# Patient Record
Sex: Female | Born: 1960 | Race: White | Hispanic: No | Marital: Married | Smoking: Never smoker
Health system: Southern US, Community
[De-identification: ages and names within clinical notes are randomized; demographics above are authoritative.]

## PROBLEM LIST (undated history)

## (undated) ENCOUNTER — Emergency Department (HOSPITAL_BASED_OUTPATIENT_CLINIC_OR_DEPARTMENT_OTHER): Disposition: A | Discharge status: Home / Self Care | Payer: Self-pay

## (undated) DIAGNOSIS — T8859XA Other complications of anesthesia, initial encounter: Secondary | ICD-10-CM

## (undated) DIAGNOSIS — B019 Varicella without complication: Secondary | ICD-10-CM

## (undated) DIAGNOSIS — I341 Nonrheumatic mitral (valve) prolapse: Secondary | ICD-10-CM

## (undated) DIAGNOSIS — T7840XA Allergy, unspecified, initial encounter: Secondary | ICD-10-CM

## (undated) DIAGNOSIS — T4145XA Adverse effect of unspecified anesthetic, initial encounter: Secondary | ICD-10-CM

## (undated) HISTORY — DX: Nonrheumatic mitral (valve) prolapse: I34.1

## (undated) HISTORY — DX: Allergy, unspecified, initial encounter: T78.40XA

## (undated) HISTORY — DX: Other complications of anesthesia, initial encounter: T88.59XA

## (undated) HISTORY — PX: DILATION AND CURETTAGE OF UTERUS: SHX78

## (undated) HISTORY — DX: Varicella without complication: B01.9

---
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
## 1982-10-09 HISTORY — PX: BREAST BIOPSY: SHX20

## 2001-10-09 HISTORY — PX: APPENDECTOMY: SHX54

## 2013-06-18 ENCOUNTER — Ambulatory Visit: Payer: Self-pay | Admitting: Family Medicine

## 2013-07-01 ENCOUNTER — Ambulatory Visit: Payer: Self-pay | Admitting: Family Medicine

## 2013-07-04 ENCOUNTER — Encounter: Payer: Self-pay | Admitting: Family Medicine

## 2013-07-04 ENCOUNTER — Ambulatory Visit (INDEPENDENT_AMBULATORY_CARE_PROVIDER_SITE_OTHER): Payer: BC Managed Care – PPO | Admitting: Family Medicine

## 2013-07-04 VITALS — BP 108/68 | HR 75 | Temp 98.1°F | Ht 62.0 in | Wt 129.0 lb

## 2013-07-04 DIAGNOSIS — M722 Plantar fascial fibromatosis: Secondary | ICD-10-CM

## 2013-07-04 DIAGNOSIS — D179 Benign lipomatous neoplasm, unspecified: Secondary | ICD-10-CM

## 2013-07-04 DIAGNOSIS — Z1211 Encounter for screening for malignant neoplasm of colon: Secondary | ICD-10-CM

## 2013-07-04 NOTE — Progress Notes (Signed)
  Subjective:    Patient ID: Rebekah Pratt, female    DOB: 02/24/1961, 52 y.o.   MRN: [id number]  HPI Patient is seen to establish care Generally very healthy. She's had some occasional seasonal allergies. She takes no regular medications. She had ruptured appendix back in 2003. She's had previous tonsillectomy. 1980- breast biopsy which was benign.  Patient has family history of CAD. Father had bypass at age 49 and also history of hypertension. Family history otherwise unrevealing. Patient is married. Nonsmoker. Only social alcohol use.  Left heel pain intermittently. Worse first thing in the morning.  Location is plantar fascia. 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 Not tender.  Patient never had screening colonoscopy. She gets regular GYN followup. She refuses flu vaccine  Past Medical History  Diagnosis Date  . Chicken pox   . Allergy    Past Surgical History  Procedure Laterality Date  . Breast biopsy Bilateral 1984  . Appendectomy    . Tonsillectomy and adenoidectomy  1970    reports that she has never smoked. She does not have any smokeless tobacco history on file. She reports that  drinks alcohol. She reports that she does not use illicit drugs. family history includes Arthritis in her father; Heart disease (age of onset: 61) in her father; Hypertension in her father. Allergies  Allergen Reactions  . Penicillins       Review of Systems  Constitutional: Negative for fever, chills and unexpected weight change.  Respiratory: Negative for cough and shortness of breath.   Gastrointestinal: Negative for nausea and vomiting.  Endocrine: Negative for polydipsia and polyuria.  Genitourinary: Negative for dysuria.  Neurological: Negative for dizziness.  Hematological: Negative for adenopathy.       Objective:   Physical Exam  Constitutional: She  appears well-developed and well-nourished.  Neck: Neck supple. No thyromegaly present.  Cardiovascular: Normal rate and regular rhythm.   Pulmonary/Chest: Effort normal and breath sounds normal. No respiratory distress. She has no wheezes. She has no rales.  Lymphadenopathy:    She has no cervical adenopathy.  Skin:  Left upper posterior thigh reveals approximately 1 cm fatty type mass which is minimally mobile and nontender          Assessment & Plan:  #1 plantar fasciitis. We discussed various treatments. Minimally symptomatic at this time. 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 This has been present for 20 years and unchanged. Reassurance #3 health maintenance. Needs screening colonoscopy. We'll set up

## 2013-07-04 NOTE — Patient Instructions (Signed)

## 2013-08-18 ENCOUNTER — Ambulatory Visit (AMBULATORY_SURGERY_CENTER): Payer: Self-pay

## 2013-08-18 DIAGNOSIS — Z1211 Encounter for screening for malignant neoplasm of colon: Secondary | ICD-10-CM

## 2013-08-18 MED ORDER — MOVIPREP 100 G PO SOLR: 1.0000 | Freq: Once | ORAL | Status: DC

## 2013-08-21 ENCOUNTER — Encounter: Payer: Self-pay | Admitting: Internal Medicine

## 2013-08-22 ENCOUNTER — Ambulatory Visit (INDEPENDENT_AMBULATORY_CARE_PROVIDER_SITE_OTHER): Payer: BC Managed Care – PPO | Admitting: Family Medicine

## 2013-08-22 ENCOUNTER — Encounter: Payer: Self-pay | Admitting: Family Medicine

## 2013-08-22 VITALS — BP 118/70 | HR 65 | Temp 97.6°F | Wt 135.0 lb

## 2013-08-22 DIAGNOSIS — R300 Dysuria: Secondary | ICD-10-CM

## 2013-08-22 DIAGNOSIS — N390 Urinary tract infection, site not specified: Secondary | ICD-10-CM

## 2013-08-22 LAB — POCT URINALYSIS DIPSTICK
Bilirubin, UA: NEGATIVE
Glucose, UA: NEGATIVE
Ketones, UA: NEGATIVE
Nitrite, UA: NEGATIVE
Protein, UA: NEGATIVE
Spec Grav, UA: <=1.005
pH, UA: 6

## 2013-08-22 MED ORDER — NITROFURANTOIN MONOHYD MACRO 100 MG PO CAPS: 100.0000 mg | ORAL_CAPSULE | Freq: Two times a day (BID) | ORAL | Status: DC

## 2013-08-22 NOTE — Patient Instructions (Signed)
Urinary Tract Infection  Urinary tract infections (UTIs) can develop anywhere along your urinary tract. Your urinary tract is your body's drainage system for removing wastes and extra water. Your urinary tract includes two kidneys, two ureters, a bladder, and a urethra. Your kidneys are a pair of bean-shaped organs. Each kidney is about the size of your fist. They are located below your ribs, one on each side of your spine.  CAUSES  Infections are caused by microbes, which are microscopic organisms, including fungi, viruses, and bacteria. These organisms are so small that they can only be seen through a microscope. Bacteria are the microbes that most commonly cause UTIs.  SYMPTOMS   Symptoms of UTIs may vary by age and gender of the patient and by the location of the infection. Symptoms in young women typically include a frequent and intense urge to urinate and a painful, burning feeling in the bladder or urethra during urination. Older women and men are more likely to be tired, shaky, and weak and have muscle aches and abdominal pain. A fever may mean the infection is in your kidneys. Other symptoms of a kidney infection include pain in your back or sides below the ribs, nausea, and vomiting.  DIAGNOSIS  To diagnose a UTI, your caregiver will ask you about your symptoms. Your caregiver also will ask to provide a urine sample. The urine sample will be tested for bacteria and white blood cells. White blood cells are made by your body to help fight infection.  TREATMENT   Typically, UTIs can be treated with medication. Because most UTIs are caused by a bacterial infection, they usually can be treated with the use of antibiotics. The choice of antibiotic and length of treatment depend on your symptoms and the type of bacteria causing your infection.  HOME CARE INSTRUCTIONS   If you were prescribed antibiotics, take them exactly as your caregiver instructs you. Finish the medication even if you feel better after you  have only taken some of the medication.   Drink enough water and fluids to keep your urine clear or pale yellow.   Avoid caffeine, tea, and carbonated beverages. They tend to irritate your bladder.   Empty your bladder often. Avoid holding urine for long periods of time.   Empty your bladder before and after sexual intercourse.   After a bowel movement, women should cleanse from front to back. Use each tissue only once.  SEEK MEDICAL CARE IF:    You have back pain.   You develop a fever.   Your symptoms do not begin to resolve within 3 days.  SEEK IMMEDIATE MEDICAL CARE IF:    You have severe back pain or lower abdominal pain.   You develop chills.   You have nausea or vomiting.   You have continued burning or discomfort with urination.  MAKE SURE YOU:    Understand these instructions.   Will watch your condition.   Will get help right away if you are not doing well or get worse.  Document Released: 07/05/2005 Document Revised: 03/26/2012 Document Reviewed: 11/03/2011  [employer] Patient Information 2014 [employer].

## 2013-08-22 NOTE — Progress Notes (Signed)
Pre visit review using our clinic review tool, if applicable. No additional management support is needed unless otherwise documented below in the visit note. 

## 2013-08-22 NOTE — Progress Notes (Signed)
  Subjective:    Patient ID: Rebekah Pratt, female    DOB: [birth year], 52 y.o.   MRN: [id number]  HPI Patient seen for acute visit Onset a few days ago of some burning with urination and actually had a little bit of mild hematuria earlier today. No nausea or vomiting. No flank pain. No fevers or chills. Drinking fluids well. She's had previous history of UTI but not for least 3 years. No recent antibiotic use. No recent procedures or instrumentation.  Past Medical History  Diagnosis Date  . Chicken pox   . Allergy    Past Surgical History  Procedure Laterality Date  . Breast biopsy Bilateral 1984  . Appendectomy    . Tonsillectomy and adenoidectomy  1970    reports that she has never smoked. She does not have any smokeless tobacco history on file. She reports that she drinks alcohol. She reports that she does not use illicit drugs. family history includes Arthritis in her father; Heart disease (age of onset: 56) in her father; Hypertension in her father. There is no history of Colon cancer, Pancreatic cancer, or Stomach cancer. Allergies  Allergen Reactions  . Penicillins       Review of Systems  Constitutional: Negative for fever, chills and appetite change.  Gastrointestinal: Negative for nausea, vomiting, abdominal pain, diarrhea and constipation.  Genitourinary: Positive for dysuria and frequency.  Musculoskeletal: Negative for back pain.  Neurological: Negative for dizziness.       Objective:   Physical Exam  Constitutional: She appears well-developed and well-nourished.  HENT:  Head: Normocephalic and atraumatic.  Neck: Neck supple. No thyromegaly present.  Cardiovascular: Normal rate, regular rhythm and normal heart sounds.   Pulmonary/Chest: Breath sounds normal.  Abdominal: Soft. Bowel sounds are normal. There is no tenderness.          Assessment & Plan:  Probable uncomplicated cystitis. Start Macrobid 1 twice a day for 5 days. Increase fluid intake.  Followup if symptoms persist or worsen

## 2013-08-26 ENCOUNTER — Encounter: Payer: BC Managed Care – PPO | Admitting: Internal Medicine

## 2013-10-13 ENCOUNTER — Encounter: Payer: BC Managed Care – PPO | Admitting: Internal Medicine

## 2013-10-31 ENCOUNTER — Encounter: Payer: Self-pay | Admitting: Family Medicine

## 2013-10-31 ENCOUNTER — Ambulatory Visit (INDEPENDENT_AMBULATORY_CARE_PROVIDER_SITE_OTHER): Payer: BC Managed Care – PPO | Admitting: Family Medicine

## 2013-10-31 VITALS — BP 110/70 | HR 60 | Temp 98.0°F | Wt 133.0 lb

## 2013-10-31 DIAGNOSIS — M722 Plantar fascial fibromatosis: Secondary | ICD-10-CM

## 2013-10-31 NOTE — Progress Notes (Signed)
Pre visit review using our clinic review tool, if applicable. No additional management support is needed unless otherwise documented below in the visit note. 

## 2013-10-31 NOTE — Patient Instructions (Signed)
Plantar Fasciitis (Heel Spur Syndrome)  with Rehab  The plantar fascia is a fibrous, ligament-like, soft-tissue structure that spans the bottom of the foot. Plantar fasciitis is a condition that causes pain in the foot due to inflammation of the tissue.  SYMPTOMS   · Pain and tenderness on the underneath side of the foot.  · Pain that worsens with standing or walking.  CAUSES   Plantar fasciitis is caused by irritation and injury to the plantar fascia on the underneath side of the foot. Common mechanisms of injury include:  · Direct trauma to bottom of the foot.  · Damage to a small nerve that runs under the foot where the main fascia attaches to the heel bone.  · Stress placed on the plantar fascia due to bone spurs.  RISK INCREASES WITH:   · Activities that place stress on the plantar fascia (running, jumping, pivoting, or cutting).  · Poor strength and flexibility.  · Improperly fitted shoes.  · Tight calf muscles.  · Flat feet.  · Failure to warm-up properly before activity.  · Obesity.  PREVENTION  · Warm up and stretch properly before activity.  · Allow for adequate recovery between workouts.  · Maintain physical fitness:  · Strength, flexibility, and endurance.  · Cardiovascular fitness.  · Maintain a health body weight.  · Avoid stress on the plantar fascia.  · Wear properly fitted shoes, including arch supports for individuals who have flat feet.  PROGNOSIS   If treated properly, then the symptoms of plantar fasciitis usually resolve without surgery. However, occasionally surgery is necessary.  RELATED COMPLICATIONS   · Recurrent symptoms that may result in a chronic condition.  · Problems of the lower back that are caused by compensating for the injury, such as limping.  · Pain or weakness of the foot during push-off following surgery.  · Chronic inflammation, scarring, and partial or complete fascia tear, occurring more often from repeated injections.  TREATMENT   Treatment initially involves the use of  ice and medication to help reduce pain and inflammation. The use of strengthening and stretching exercises may help reduce pain with activity, especially stretches of the Achilles tendon. These exercises may be performed at home or with a therapist. Your caregiver may recommend that you use heel cups of arch supports to help reduce stress on the plantar fascia. Occasionally, corticosteroid injections are given to reduce inflammation. If symptoms persist for greater than 6 months despite non-surgical (conservative), then surgery may be recommended.   MEDICATION   · If pain medication is necessary, then nonsteroidal anti-inflammatory medications, such as aspirin and ibuprofen, or other minor pain relievers, such as acetaminophen, are often recommended.  · Do not take pain medication within 7 days before surgery.  · Prescription pain relievers may be given if deemed necessary by your caregiver. Use only as directed and only as much as you need.  · Corticosteroid injections may be given by your caregiver. These injections should be reserved for the most serious cases, because they may only be given a certain number of times.  HEAT AND COLD  · Cold treatment (icing) relieves pain and reduces inflammation. Cold treatment should be applied for 10 to 15 minutes every 2 to 3 hours for inflammation and pain and immediately after any activity that aggravates your symptoms. Use ice packs or massage the area with a piece of ice (ice massage).  · Heat treatment may be used prior to performing the stretching and strengthening activities prescribed   by your caregiver, physical therapist, or athletic trainer. Use a heat pack or soak the injury in warm water.  SEEK IMMEDIATE MEDICAL CARE IF:  · Treatment seems to offer no benefit, or the condition worsens.  · Any medications produce adverse side effects.  EXERCISES  RANGE OF MOTION (ROM) AND STRETCHING EXERCISES - Plantar Fasciitis (Heel Spur Syndrome)  These exercises may help you  when beginning to rehabilitate your injury. Your symptoms may resolve with or without further involvement from your physician, physical therapist or athletic trainer. While completing these exercises, remember:   · Restoring tissue flexibility helps normal motion to return to the joints. This allows healthier, less painful movement and activity.  · An effective stretch should be held for at least 30 seconds.  · A stretch should never be painful. You should only feel a gentle lengthening or release in the stretched tissue.  RANGE OF MOTION - Toe Extension, Flexion  · Sit with your right / left leg crossed over your opposite knee.  · Grasp your toes and gently pull them back toward the top of your foot. You should feel a stretch on the bottom of your toes and/or foot.  · Hold this stretch for __________ seconds.  · Now, gently pull your toes toward the bottom of your foot. You should feel a stretch on the top of your toes and or foot.  · Hold this stretch for __________ seconds.  Repeat __________ times. Complete this stretch __________ times per day.   RANGE OF MOTION - Ankle Dorsiflexion, Active Assisted  · Remove shoes and sit on a chair that is preferably not on a carpeted surface.  · Place right / left foot under knee. Extend your opposite leg for support.  · Keeping your heel down, slide your right / left foot back toward the chair until you feel a stretch at your ankle or calf. If you do not feel a stretch, slide your bottom forward to the edge of the chair, while still keeping your heel down.  · Hold this stretch for __________ seconds.  Repeat __________ times. Complete this stretch __________ times per day.   STRETCH  Gastroc, Standing  · Place hands on wall.  · Extend right / left leg, keeping the front knee somewhat bent.  · Slightly point your toes inward on your back foot.  · Keeping your right / left heel on the floor and your knee straight, shift your weight toward the wall, not allowing your back to  arch.  · You should feel a gentle stretch in the right / left calf. Hold this position for __________ seconds.  Repeat __________ times. Complete this stretch __________ times per day.  STRETCH  Soleus, Standing  · Place hands on wall.  · Extend right / left leg, keeping the other knee somewhat bent.  · Slightly point your toes inward on your back foot.  · Keep your right / left heel on the floor, bend your back knee, and slightly shift your weight over the back leg so that you feel a gentle stretch deep in your back calf.  · Hold this position for __________ seconds.  Repeat __________ times. Complete this stretch __________ times per day.  STRETCH  Gastrocsoleus, Standing   Note: This exercise can place a lot of stress on your foot and ankle. Please complete this exercise only if specifically instructed by your caregiver.   · Place the ball of your right / left foot on a step, keeping   your other foot firmly on the same step.  · Hold on to the wall or a rail for balance.  · Slowly lift your other foot, allowing your body weight to press your heel down over the edge of the step.  · You should feel a stretch in your right / left calf.  · Hold this position for __________ seconds.  · Repeat this exercise with a slight bend in your right / left knee.  Repeat __________ times. Complete this stretch __________ times per day.   STRENGTHENING EXERCISES - Plantar Fasciitis (Heel Spur Syndrome)   These exercises may help you when beginning to rehabilitate your injury. They may resolve your symptoms with or without further involvement from your physician, physical therapist or athletic trainer. While completing these exercises, remember:   · Muscles can gain both the endurance and the strength needed for everyday activities through controlled exercises.  · Complete these exercises as instructed by your physician, physical therapist or athletic trainer. Progress the resistance and repetitions only as guided.  STRENGTH - Towel  Curls  · Sit in a chair positioned on a non-carpeted surface.  · Place your foot on a towel, keeping your heel on the floor.  · Pull the towel toward your heel by only curling your toes. Keep your heel on the floor.  · If instructed by your physician, physical therapist or athletic trainer, add ____________________ at the end of the towel.  Repeat __________ times. Complete this exercise __________ times per day.  STRENGTH - Ankle Inversion  · Secure one end of a rubber exercise band/tubing to a fixed object (table, pole). Loop the other end around your foot just before your toes.  · Place your fists between your knees. This will focus your strengthening at your ankle.  · Slowly, pull your big toe up and in, making sure the band/tubing is positioned to resist the entire motion.  · Hold this position for __________ seconds.  · Have your muscles resist the band/tubing as it slowly pulls your foot back to the starting position.  Repeat __________ times. Complete this exercises __________ times per day.   Document Released: 09/25/2005 Document Revised: 12/18/2011 Document Reviewed: 01/07/2009  [employer]® Patient Information ©2014 [employer].

## 2013-10-31 NOTE — Progress Notes (Signed)
   Subjective:    Patient ID: Rebekah Pratt, female    DOB: [birth year], 53 y.o.   MRN: [id number]  HPI Patient seen with left heel pain. She has history of plantar fasciitis. She's had several months now probably 6 months of pain which is worse first thing in the morning and after prolonged periods of sitting. Her pain actually improves after she walks for a ways. She's not had any Achilles pain this is on the plantar aspect of the foot. She's not seen bruising or swelling. No injury. She tried some icing briefly without much improvement. She's done some stretching. She has fairly high arches and does not use any arch support. Currently not doing any running.  Past Medical History  Diagnosis Date  . Chicken pox   . Allergy    Past Surgical History  Procedure Laterality Date  . Breast biopsy Bilateral 1984  . Appendectomy    . Tonsillectomy and adenoidectomy  1970    reports that she has never smoked. She does not have any smokeless tobacco history on file. She reports that she drinks alcohol. She reports that she does not use illicit drugs. family history includes Arthritis in her father; Heart disease (age of onset: 54) in her father; Hypertension in her father. There is no history of Colon cancer, Pancreatic cancer, or Stomach cancer. Allergies  Allergen Reactions  . Penicillins       Review of Systems  Neurological: Negative for weakness and numbness.       Objective:   Physical Exam  Constitutional: She appears well-developed and well-nourished.  Cardiovascular: Normal rate and regular rhythm.   Pulmonary/Chest: Effort normal and breath sounds normal. No respiratory distress. She has no wheezes. She has no rales.  Musculoskeletal:  Left foot reveals no visible swelling. Good distal foot pulses. She has some tenderness of the heel near attachment plantar fascia to the calcaneus. No Achilles tenderness. Otherwise no foot tenderness.          Assessment & Plan:  Left  plantar fasciitis. We reviewed treatments including stretching, icing, anti-inflammatories, heel cups. She does not wish to pursue injection therapy at this time. We discussed arch supports. Consider referral to sports medicine for possible orthotics if not improving.

## 2014-01-16 LAB — HM MAMMOGRAPHY: HM MAMMO: NEGATIVE

## 2014-03-30 ENCOUNTER — Encounter: Payer: Self-pay | Admitting: Gastroenterology

## 2014-04-13 ENCOUNTER — Ambulatory Visit (INDEPENDENT_AMBULATORY_CARE_PROVIDER_SITE_OTHER): Payer: BC Managed Care – PPO | Admitting: Family Medicine

## 2014-04-13 DIAGNOSIS — Z111 Encounter for screening for respiratory tuberculosis: Secondary | ICD-10-CM

## 2014-04-13 DIAGNOSIS — Z0289 Encounter for other administrative examinations: Secondary | ICD-10-CM

## 2014-04-13 MED ORDER — FLUTICASONE PROPIONATE 50 MCG/ACT NA SUSP: 2.0000 | NASAL | Status: DC | PRN

## 2014-04-13 NOTE — Progress Notes (Signed)
   Subjective:    Patient ID: Rebekah Pratt, female    DOB: 1961/06/06, 53 y.o.   MRN: [id number]  HPI Patient here for form completion. She plans to work in [employer].  She has no major chronic problems. No history of tuberculosis or other communicable diseases. She thinks she had tetanus in 2010. Measles mumps rubella given previously. Denies any heart or lung problems. No major orthopedic concerns other than some intermittent neck pains. She has seen specialist regarding that.    Past Medical History  Diagnosis Date  . Chicken pox   . Allergy    Past Surgical History  Procedure Laterality Date  . Breast biopsy Bilateral 1984  . Appendectomy    . Tonsillectomy and adenoidectomy  1970    reports that she has never smoked. She does not have any smokeless tobacco history on file. She reports that she drinks alcohol. She reports that she does not use illicit drugs. family history includes Arthritis in her father; Heart disease (age of onset: 35) in her father; Hypertension in her father. There is no history of Colon cancer, Pancreatic cancer, or Stomach cancer. Allergies  Allergen Reactions  . Penicillins       Review of Systems  Constitutional: Negative for fatigue.  Eyes: Negative for visual disturbance.  Respiratory: Negative for cough, chest tightness, shortness of breath and wheezing.   Cardiovascular: Negative for chest pain, palpitations and leg swelling.  Neurological: Negative for dizziness, seizures, syncope, weakness, light-headedness and headaches.       Objective:   Physical Exam  Constitutional: She appears well-developed and well-nourished.  Neck: Neck supple. No thyromegaly present.  Cardiovascular: Normal rate and regular rhythm.   No murmur heard. Pulmonary/Chest: Effort normal and breath sounds normal. No respiratory distress. She has no wheezes. She has no rales.  Musculoskeletal: She exhibits no edema.  Lymphadenopathy:    She has no  cervical adenopathy.          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 No specific risk factors for tuberculosis-or other communicable disease. PPD placed. Return in 48-72 hours to have this read.

## 2014-04-13 NOTE — Patient Instructions (Signed)
Return Wednesday for PPD to be read

## 2014-04-13 NOTE — Progress Notes (Signed)
Pre visit review using our clinic review tool, if applicable. No additional management support is needed unless otherwise documented below in the visit note. 

## 2014-04-15 LAB — TB SKIN TEST
INDURATION: 0 mm
TB Skin Test: NEGATIVE

## 2014-04-17 ENCOUNTER — Ambulatory Visit (AMBULATORY_SURGERY_CENTER): Payer: Self-pay | Admitting: *Deleted

## 2014-04-17 VITALS — Ht 62.0 in | Wt 130.4 lb

## 2014-04-17 DIAGNOSIS — Z1211 Encounter for screening for malignant neoplasm of colon: Secondary | ICD-10-CM

## 2014-04-17 NOTE — Progress Notes (Signed)
No allergies to eggs or soy. No problems with anesthesia.  Pt given Emmi instructions for colonoscopy  No oxygen use  No diet drug use  

## 2014-04-27 ENCOUNTER — Encounter: Payer: Self-pay | Admitting: Gastroenterology

## 2014-05-08 ENCOUNTER — Encounter: Payer: Self-pay | Admitting: Gastroenterology

## 2014-05-08 ENCOUNTER — Ambulatory Visit (AMBULATORY_SURGERY_CENTER): Payer: BC Managed Care – PPO | Admitting: Gastroenterology

## 2014-05-08 VITALS — BP 109/48 | HR 54 | Temp 97.5°F | Resp 40 | Ht 62.0 in | Wt 130.0 lb

## 2014-05-08 DIAGNOSIS — D126 Benign neoplasm of colon, unspecified: Secondary | ICD-10-CM

## 2014-05-08 DIAGNOSIS — Z1211 Encounter for screening for malignant neoplasm of colon: Secondary | ICD-10-CM

## 2014-05-08 MED ORDER — SODIUM CHLORIDE 0.9 % IV SOLN: 500.0000 mL | INTRAVENOUS | Status: DC

## 2014-05-08 NOTE — Progress Notes (Signed)
Called to room to assist during endoscopic procedure.  Patient ID and intended procedure confirmed with present staff. Received instructions for my participation in the procedure from the performing physician.  

## 2014-05-08 NOTE — Patient Instructions (Signed)
YOU HAD AN ENDOSCOPIC PROCEDURE TODAY AT THE [facility]: Refer to the procedure report that was given to you for any specific questions about what was found during the examination.  If the procedure report does not answer your questions, please call your gastroenterologist to clarify.  If you requested that your care partner not be given the details of your procedure findings, then the procedure report has been included in a sealed envelope for you to review at your convenience later.  YOU SHOULD EXPECT: Some feelings of bloating in the abdomen. Passage of more gas than usual.  Walking can help get rid of the air that was put into your GI tract during the procedure and reduce the bloating. If you had a lower endoscopy (such as a colonoscopy or flexible sigmoidoscopy) you may notice spotting of blood in your stool or on the toilet paper. If you underwent a bowel prep for your procedure, then you may not have a normal bowel movement for a few days.  DIET: Your first meal following the procedure should be a light meal and then it is ok to progress to your normal diet.  A half-sandwich or bowl of soup is an example of a good first meal.  Heavy or fried foods are harder to digest and may make you feel nauseous or bloated.  Likewise meals heavy in dairy and vegetables can cause extra gas to form and this can also increase the bloating.  Drink plenty of fluids but you should avoid alcoholic beverages for 24 hours.  ACTIVITY: Your care partner should take you home directly after the procedure.  You should plan to take it easy, moving slowly for the rest of the day.  You can resume normal activity the day after the procedure however you should NOT DRIVE or use heavy machinery for 24 hours (because of the sedation medicines used during the test).    SYMPTOMS TO REPORT IMMEDIATELY: A gastroenterologist can be reached at any hour.  During normal business hours, 8:30 AM to 5:00 PM Monday through Friday,  call [phone].  After hours and on weekends, please call the GI answering service at [phone] who will take a message and have the physician on call contact you.   Following lower endoscopy (colonoscopy or flexible sigmoidoscopy):  Excessive amounts of blood in the stool  Significant tenderness or worsening of abdominal pains  Swelling of the abdomen that is new, acute  Fever of 100F or higher  FOLLOW UP: If any biopsies were taken you will be contacted by phone or by letter within the next 1-3 weeks.  Call your gastroenterologist if you have not heard about the biopsies in 3 weeks.  Our staff will call the home number listed on your records the next business day following your procedure to check on you and address any questions or concerns that you may have at that time regarding the information given to you following your procedure. This is a courtesy call and so if there is no answer at the home number and we have not heard from you through the emergency physician on call, we will assume that you have returned to your regular daily activities without incident.  SIGNATURES/CONFIDENTIALITY: You and/or your care partner have signed paperwork which will be entered into your electronic medical record.  These signatures attest to the fact that that the information above on your After Visit Summary has been reviewed and is understood.  Full responsibility of the confidentiality of this   discharge information lies with you and/or your care-partner.  Recommendations Await pathology results. Next colonoscopy in 5 years if polyp adenomatous; otherwise 10 years.

## 2014-05-08 NOTE — Op Note (Signed)
[place]  [address]. [place], [id number]   COLONOSCOPY PROCEDURE REPORT  PATIENT: Rebekah Pratt, Rebekah Pratt  MR#: [id number] BIRTHDATE: Jun 23, 1961 , 56  yrs. old GENDER: Female ENDOSCOPIST: Ladene Artist, MD, [facility] REFERRED VQ:[id number] Elease Hashimoto, M.D. PROCEDURE DATE:  05/08/2014 PROCEDURE:   Colonoscopy with snare polypectomy First Screening Colonoscopy - Avg.  risk and is 50 yrs.  old or older Yes.  Prior Negative Screening - Now for repeat screening. N/A  History of Adenoma - Now for follow-up colonoscopy & has been > or = to 3 yrs.  N/A  Polyps Removed Today? Yes. ASA CLASS:   Class I INDICATIONS:average risk screening. MEDICATIONS: MAC sedation, administered by CRNA and propofol (Diprivan) 250mg  IV DESCRIPTION OF PROCEDURE:   After the risks benefits and alternatives of the procedure were thoroughly explained, informed consent was obtained.  A digital rectal exam revealed no abnormalities of the rectum.   The LB [id number]  endoscope was introduced through the anus and advanced to the cecum, which was identified by both the appendix and ileocecal valve. No adverse events experienced.   The quality of the prep was excellent, using MoviPrep  The instrument was then slowly withdrawn as the colon was fully examined.  COLON FINDINGS: A sessile polyp measuring 6 mm in size was found at the ileocecal valve.  A polypectomy was performed with a cold snare.  The resection was complete and the polyp tissue was completely retrieved.   The colon was otherwise normal.  There was no diverticulosis, inflammation, polyps or cancers unless previously stated.  Retroflexed views revealed no abnormalities. The time to cecum=4 minutes 21 seconds.  Withdrawal time=8 minutes 16 seconds.  The scope was withdrawn and the procedure completed. COMPLICATIONS: There were no complications.  ENDOSCOPIC IMPRESSION: 1.   Sessile polyp measuring 6 mm at the ileocecal valve; polypectomy  performed with a cold snare 2.   The colon was otherwise normal  RECOMMENDATIONS: 1.  Await pathology results 2.  Repeat colonoscopy in 5 years if polyp adenomatous; otherwise 10 years  eSigned:  Ladene Artist, MD, [facility] 05/08/2014 1:47 PM

## 2014-05-08 NOTE — Progress Notes (Signed)
Report to PACU, RN, vss, BBS= Clear.  

## 2014-05-11 ENCOUNTER — Telehealth: Payer: Self-pay | Admitting: *Deleted

## 2014-05-11 NOTE — Telephone Encounter (Signed)
  Follow up Call-  Unable to reach pt. With numbers provided.

## 2014-05-16 ENCOUNTER — Encounter: Payer: Self-pay | Admitting: Gastroenterology

## 2014-10-01 ENCOUNTER — Ambulatory Visit (INDEPENDENT_AMBULATORY_CARE_PROVIDER_SITE_OTHER): Payer: BC Managed Care – PPO | Admitting: Family Medicine

## 2014-10-01 ENCOUNTER — Ambulatory Visit (INDEPENDENT_AMBULATORY_CARE_PROVIDER_SITE_OTHER)
Admission: RE | Admit: 2014-10-01 | Discharge: 2014-10-01 | Disposition: A | Discharge status: Home / Self Care | Payer: BC Managed Care – PPO | Source: Ambulatory Visit | Attending: Family Medicine | Admitting: Family Medicine

## 2014-10-01 ENCOUNTER — Encounter: Payer: Self-pay | Admitting: Family Medicine

## 2014-10-01 VITALS — BP 110/68 | HR 69 | Temp 97.9°F | Wt 131.0 lb

## 2014-10-01 DIAGNOSIS — M79651 Pain in right thigh: Secondary | ICD-10-CM

## 2014-10-01 DIAGNOSIS — M8580 Other specified disorders of bone density and structure, unspecified site: Secondary | ICD-10-CM

## 2014-10-01 MED ORDER — GABAPENTIN 100 MG PO CAPS: 100.0000 mg | ORAL_CAPSULE | Freq: Three times a day (TID) | ORAL | Status: DC

## 2014-10-01 NOTE — Progress Notes (Signed)
   Subjective:    Patient ID: Rebekah Pratt, female    DOB: Nov 02, 1960, 53 y.o.   MRN: [id number]  HPI Patient seen to discuss the following items:  Recent heel screen for bone density. T score -1.0. She does walk some for exercise. Inconsistent calcium use. Occasionally takes vitamin D. No history of fractures. Nonsmoker. No specific risk factors for osteoporosis other than being Caucasian and low body weight.  Right lower extremity pain off and on for several months. She describes a dull achy pain mostly distal femur region sometimes radiating down toward the leg. Denies any back pain. Sometimes worse with standing but occasionally at rest. Never noted any edema or swelling. No ecchymosis. Pain is 8 out of 10 is worst. Not exacerbated by walking. No numbness. No loss of bladder or bowel control. No alleviating factors.  Past Medical History  Diagnosis Date  . Chicken pox   . Allergy   . Mitral valve prolapse    Past Surgical History  Procedure Laterality Date  . Breast biopsy Bilateral 1984  . Appendectomy  2003  . Tonsillectomy and adenoidectomy  1970  . Eye muscle surgery  1967    reports that she has never smoked. She has never used smokeless tobacco. She reports that she drinks about 1.2 oz of alcohol per week. She reports that she does not use illicit drugs. family history includes Arthritis in her father; Heart disease (age of onset: 13) in her father; Hypertension in her father. There is no history of Colon cancer, Pancreatic cancer, or Stomach cancer. Allergies  Allergen Reactions  . Penicillins Swelling      Review of Systems  Constitutional: Negative for fever and chills.  Respiratory: Negative for shortness of breath.   Cardiovascular: Negative for chest pain and leg swelling.  Genitourinary: Negative for dysuria.  Musculoskeletal: Negative for back pain.  Skin: Negative for rash.  Neurological: Negative for weakness and numbness.       Objective:   Physical  Exam  Constitutional: She appears well-developed and well-nourished.  Cardiovascular: Normal rate and regular rhythm.   Pulmonary/Chest: Effort normal and breath sounds normal. No respiratory distress. She has no wheezes. She has no rales.  Musculoskeletal: She exhibits no edema.  Right knee reveals full range of motion. No leg edema. Full range of motion right hip.  Neurological:  Full strength right lower extremity. Normal sensory function touch. 2+ reflex ankle and knee bilaterally.          Assessment & Plan:  #1 osteopenia by recent bone density scan. We've discussed prevention with weightbearing exercise and adequate calcium 1200 mg daily vitamin D 800 to 1,000 international units daily. Consider full DEXA scan in 2 years #2 right distal thigh pain.  She has pain radiating all the way down to the leg at times. Question neuropathic pain. No reproducible pain. Doubt musculoskeletal. Start with plain x-rays of femur. May need MRI lumbar spine to further clarify  X-rays are normal.  Pt notified.  We discussed trial of low dose gabapentin 100 mg qhs and titrate to one po TID.   She will give me some feedback in 2-3 weeks with response.

## 2014-10-01 NOTE — Progress Notes (Signed)
Pre visit review using our clinic review tool, if applicable. No additional management support is needed unless otherwise documented below in the visit note. 

## 2014-10-03 DIAGNOSIS — M810 Age-related osteoporosis without current pathological fracture: Secondary | ICD-10-CM | POA: Insufficient documentation

## 2014-10-03 DIAGNOSIS — M8580 Other specified disorders of bone density and structure, unspecified site: Secondary | ICD-10-CM | POA: Insufficient documentation

## 2015-06-24 ENCOUNTER — Other Ambulatory Visit (INDEPENDENT_AMBULATORY_CARE_PROVIDER_SITE_OTHER): Payer: 59

## 2015-06-24 DIAGNOSIS — Z0000 Encounter for general adult medical examination without abnormal findings: Secondary | ICD-10-CM

## 2015-06-24 LAB — LIPID PANEL
CHOL/HDL RATIO: 3
Cholesterol: 249 mg/dL — ABNORMAL HIGH (ref 0–200)
HDL: 98.4 mg/dL (ref >39.00)
LDL CALC: 136 mg/dL — AB (ref 0–99)
NonHDL: 150.84
TRIGLYCERIDES: 72 mg/dL (ref 0.0–149.0)
VLDL: 14.4 mg/dL (ref 0.0–40.0)

## 2015-06-24 LAB — HEPATIC FUNCTION PANEL
ALT: 18 U/L (ref 0–35)
AST: 23 U/L (ref 0–37)
Albumin: 4.5 g/dL (ref 3.5–5.2)
Alkaline Phosphatase: 68 U/L (ref 39–117)
Bilirubin, Direct: 0.2 mg/dL (ref 0.0–0.3)
Total Bilirubin: 0.9 mg/dL (ref 0.2–1.2)
Total Protein: 8.1 g/dL (ref 6.0–8.3)

## 2015-06-24 LAB — BASIC METABOLIC PANEL
BUN: 16 mg/dL (ref 6–23)
CO2: 30 meq/L (ref 19–32)
Calcium: 10 mg/dL (ref 8.4–10.5)
Chloride: 103 mEq/L (ref 96–112)
Creatinine, Ser: 0.8 mg/dL (ref 0.40–1.20)
GFR: 79.25 mL/min (ref >60.00)
Glucose, Bld: 90 mg/dL (ref 70–99)
POTASSIUM: 4.8 meq/L (ref 3.5–5.1)
Sodium: 138 mEq/L (ref 135–145)

## 2015-06-24 LAB — CBC WITH DIFFERENTIAL/PLATELET
Basophils Absolute: 0 10*3/uL (ref 0.0–0.1)
Basophils Relative: 0.7 % (ref 0.0–3.0)
Eosinophils Absolute: 0.1 10*3/uL (ref 0.0–0.7)
Eosinophils Relative: 1.2 % (ref 0.0–5.0)
HCT: 41.4 % (ref 36.0–46.0)
Hemoglobin: 13.9 g/dL (ref 12.0–15.0)
LYMPHS ABS: 2.4 10*3/uL (ref 0.7–4.0)
Lymphocytes Relative: 39.5 % (ref 12.0–46.0)
MCHC: 33.5 g/dL (ref 30.0–36.0)
MCV: 90.1 fl (ref 78.0–100.0)
MONO ABS: 0.4 10*3/uL (ref 0.1–1.0)
Monocytes Relative: 6.4 % (ref 3.0–12.0)
NEUTROS PCT: 52.2 % (ref 43.0–77.0)
Neutro Abs: 3.1 10*3/uL (ref 1.4–7.7)
Platelets: 251 10*3/uL (ref 150.0–400.0)
RBC: 4.6 Mil/uL (ref 3.87–5.11)
RDW: 13.1 % (ref 11.5–15.5)
WBC: 6 10*3/uL (ref 4.0–10.5)

## 2015-06-24 LAB — TSH: TSH: 2.04 u[IU]/mL (ref 0.35–4.50)

## 2015-07-01 ENCOUNTER — Encounter: Payer: Self-pay | Admitting: Family Medicine

## 2015-07-01 ENCOUNTER — Ambulatory Visit (INDEPENDENT_AMBULATORY_CARE_PROVIDER_SITE_OTHER): Payer: 59 | Admitting: Family Medicine

## 2015-07-01 VITALS — BP 94/70 | HR 67 | Temp 97.6°F | Ht 62.6 in | Wt 128.9 lb

## 2015-07-01 DIAGNOSIS — Z0000 Encounter for general adult medical examination without abnormal findings: Secondary | ICD-10-CM

## 2015-07-01 NOTE — Progress Notes (Signed)
   Subjective:    Patient ID: Rebekah Pratt, female    DOB: 1961-03-03, 54 y.o.   MRN: [id number]  HPI  Patient seen for complete physical. She sees gynecologist regularly. She takes no regular medications. She thinks she had tetanus less than 10 years ago but is not sure. Never smoked. Rare alcohol use. No consistent exercise. Declines flu vaccine. Colonoscopy up-to-date.  Reviewed with no changes:  Past Medical History  Diagnosis Date  . Chicken pox   . Allergy   . Mitral valve prolapse    Past Surgical History  Procedure Laterality Date  . Breast biopsy Bilateral 1984  . Appendectomy  2003  . Tonsillectomy and adenoidectomy  1970  . Eye muscle surgery  1967    reports that she has never smoked. She has never used smokeless tobacco. She reports that she drinks about 1.2 oz of alcohol per week. She reports that she does not use illicit drugs. family history includes Arthritis in her father; Heart disease (age of onset: 36) in her father; Hypertension in her father. There is no history of Colon cancer, Pancreatic cancer, or Stomach cancer. Allergies  Allergen Reactions  . Penicillins Swelling     Review of Systems  Constitutional: Negative for fever, activity change, appetite change, fatigue and unexpected weight change.  HENT: Negative for ear pain, hearing loss, sore throat and trouble swallowing.   Eyes: Negative for visual disturbance.  Respiratory: Negative for cough and shortness of breath.   Cardiovascular: Negative for chest pain and palpitations.  Gastrointestinal: Negative for abdominal pain, diarrhea, constipation and blood in stool.  Genitourinary: Negative for dysuria and hematuria.  Musculoskeletal: Negative for myalgias, back pain and arthralgias.  Skin: Negative for rash.  Neurological: Negative for dizziness, syncope and headaches.  Hematological: Negative for adenopathy.  Psychiatric/Behavioral: Negative for confusion and dysphoric mood.         Objective:   Physical Exam  Constitutional: She is oriented to person, place, and time. She appears well-developed and well-nourished.  HENT:  Head: Normocephalic and atraumatic.  Eyes: EOM are normal. Pupils are equal, round, and reactive to light.  Neck: Normal range of motion. Neck supple. No thyromegaly present.  Cardiovascular: Normal rate, regular rhythm and normal heart sounds.   No murmur heard. Pulmonary/Chest: Breath sounds normal. No respiratory distress. She has no wheezes. She has no rales.  Abdominal: Soft. Bowel sounds are normal. She exhibits no distension and no mass. There is no tenderness. There is no rebound and no guarding.  Genitourinary:  Per gyn.  Musculoskeletal: Normal range of motion. She exhibits no edema.  Lymphadenopathy:    She has no cervical adenopathy.  Neurological: She is alert and oriented to person, place, and time. She displays normal reflexes. No cranial nerve deficit.  Skin: No rash noted.  Psychiatric: She has a normal mood and affect. Her behavior is normal. Judgment and thought content normal.          Assessment & Plan:  Complete physical. Labs reviewed. No major concerns. Confirm date of last tetanus. Flu vaccine offered and declined. We discussed importance of regular calcium and vitamin D supplementation. Recommend more frequent weightbearing exercise.

## 2015-07-01 NOTE — Patient Instructions (Signed)
Confirm date of last tetanus

## 2015-07-01 NOTE — Progress Notes (Signed)
Pre visit review using our clinic review tool, if applicable. No additional management support is needed unless otherwise documented below in the visit note. 

## 2016-04-25 ENCOUNTER — Ambulatory Visit (INDEPENDENT_AMBULATORY_CARE_PROVIDER_SITE_OTHER): Payer: BC Managed Care – PPO | Admitting: Family Medicine

## 2016-04-25 VITALS — BP 90/60 | HR 75 | Temp 98.5°F | Ht 62.6 in | Wt 126.0 lb

## 2016-04-25 DIAGNOSIS — M79644 Pain in right finger(s): Secondary | ICD-10-CM

## 2016-04-25 NOTE — Patient Instructions (Signed)
Try some icing to right hand 15 minutes two to three times daily. Follow up for any numbness or weakness.

## 2016-04-25 NOTE — Progress Notes (Signed)
Subjective:     Patient ID: Rebekah Pratt, female   DOB: 1961-09-02, 55 y.o.   MRN: [id number]  HPI Patient seen with right hand pain. Right-hand dominant. Location is thenar musculature of the thumb. Denies specific injury. Job involves lots of typing. She denies any trigger thumb. No pain at the [facility] or MCP joint of the thumb No visible swelling. She especially has noticed pain when opposing the thumb and little finger Denies any wrist pain or carpal tunnel symptoms No digit numbness  Past Medical History  Diagnosis Date  . Chicken pox   . Allergy   . Mitral valve prolapse    Past Surgical History  Procedure Laterality Date  . Breast biopsy Bilateral 1984  . Appendectomy  2003  . Tonsillectomy and adenoidectomy  1970  . Eye muscle surgery  1967    reports that she has never smoked. She has never used smokeless tobacco. She reports that she drinks about 1.2 oz of alcohol per week. She reports that she does not use illicit drugs. family history includes Arthritis in her father; Heart disease (age of onset: 35) in her father; Hypertension in her father. There is no history of Colon cancer, Pancreatic cancer, or Stomach cancer. Allergies  Allergen Reactions  . Penicillins Swelling     Review of Systems  Neurological: Negative for weakness and numbness.       Objective:   Physical Exam  Constitutional: She appears well-developed and well-nourished.  Cardiovascular: Normal rate and regular rhythm.   Musculoskeletal:  Right hand-no visible swelling, erythema, or ecchymosis. No muscle atrophy. Slight thenar muscle tenderness right thumb. No joint pain. Full range of motion thumb and wrist. She has no pain with flexion or extension of the wrist or with lateral movement.       Assessment:     Right thumb pain. This appears to be more muscular. She is not complaining of any joint pain. No evidence for trigger thumb.  Plan:     We recommend she try some icing.  Over-the-counter anti-inflammatories    Eulas Post MD [facility]

## 2016-04-25 NOTE — Progress Notes (Signed)
Pre visit review using our clinic review tool, if applicable. No additional management support is needed unless otherwise documented below in the visit note. 

## 2016-05-08 ENCOUNTER — Telehealth: Payer: Self-pay | Admitting: Family Medicine

## 2016-05-08 DIAGNOSIS — M79644 Pain in right finger(s): Secondary | ICD-10-CM

## 2016-05-08 NOTE — Telephone Encounter (Signed)
Seen on 04/25/2016 for this issues. Okay for xray?

## 2016-05-08 NOTE — Telephone Encounter (Signed)
Pt is aware that xrays have been ordered.

## 2016-05-08 NOTE — Telephone Encounter (Signed)
Go ahead with x-rays of right thumb.

## 2016-05-08 NOTE — Telephone Encounter (Signed)
Pt states she would like to go ahead and get the xray of her thumb. Dr Elease Hashimoto states he could do from her appt on 7/18. But pt had refused at the time. Right hand thumb, is blue and painful.

## 2016-05-09 ENCOUNTER — Ambulatory Visit (INDEPENDENT_AMBULATORY_CARE_PROVIDER_SITE_OTHER)
Admission: RE | Admit: 2016-05-09 | Discharge: 2016-05-09 | Disposition: A | Discharge status: Home / Self Care | Payer: BC Managed Care – PPO | Source: Ambulatory Visit | Attending: Family Medicine | Admitting: Family Medicine

## 2016-05-09 DIAGNOSIS — M79644 Pain in right finger(s): Secondary | ICD-10-CM

## 2016-08-17 ENCOUNTER — Encounter: Payer: Self-pay | Admitting: Family Medicine

## 2016-08-19 ENCOUNTER — Encounter: Payer: Self-pay | Admitting: Family Medicine

## 2016-08-21 MED ORDER — NITROFURANTOIN MONOHYD MACRO 100 MG PO CAPS: 100.0000 mg | ORAL_CAPSULE | Freq: Two times a day (BID) | ORAL | 0 refills | Status: DC

## 2016-08-21 NOTE — Telephone Encounter (Signed)
Dr. Burchette - Please advise. Thanks! 

## 2016-08-24 ENCOUNTER — Other Ambulatory Visit (INDEPENDENT_AMBULATORY_CARE_PROVIDER_SITE_OTHER): Payer: BC Managed Care – PPO

## 2016-08-24 DIAGNOSIS — Z0000 Encounter for general adult medical examination without abnormal findings: Secondary | ICD-10-CM

## 2016-08-24 LAB — BASIC METABOLIC PANEL
BUN: 16 mg/dL (ref 6–23)
CALCIUM: 9.9 mg/dL (ref 8.4–10.5)
CO2: 30 mEq/L (ref 19–32)
Chloride: 103 mEq/L (ref 96–112)
Creatinine, Ser: 0.85 mg/dL (ref 0.40–1.20)
GFR: 73.58 mL/min (ref >60.00)
GLUCOSE: 83 mg/dL (ref 70–99)
Potassium: 4.3 mEq/L (ref 3.5–5.1)
SODIUM: 139 meq/L (ref 135–145)

## 2016-08-24 LAB — CBC WITH DIFFERENTIAL/PLATELET
BASOS ABS: 0 10*3/uL (ref 0.0–0.1)
Basophils Relative: 0.6 % (ref 0.0–3.0)
Eosinophils Absolute: 0.1 10*3/uL (ref 0.0–0.7)
Eosinophils Relative: 1.8 % (ref 0.0–5.0)
HCT: 39.8 % (ref 36.0–46.0)
HEMOGLOBIN: 13.5 g/dL (ref 12.0–15.0)
LYMPHS ABS: 2 10*3/uL (ref 0.7–4.0)
Lymphocytes Relative: 40.5 % (ref 12.0–46.0)
MCHC: 33.9 g/dL (ref 30.0–36.0)
MCV: 88.3 fl (ref 78.0–100.0)
MONO ABS: 0.3 10*3/uL (ref 0.1–1.0)
MONOS PCT: 6.2 % (ref 3.0–12.0)
NEUTROS PCT: 50.9 % (ref 43.0–77.0)
Neutro Abs: 2.5 10*3/uL (ref 1.4–7.7)
Platelets: 255 10*3/uL (ref 150.0–400.0)
RBC: 4.51 Mil/uL (ref 3.87–5.11)
RDW: 12.8 % (ref 11.5–15.5)
WBC: 4.9 10*3/uL (ref 4.0–10.5)

## 2016-08-24 LAB — LIPID PANEL
CHOL/HDL RATIO: 3
Cholesterol: 251 mg/dL — ABNORMAL HIGH (ref 0–200)
HDL: 85 mg/dL (ref >39.00)
LDL CALC: 149 mg/dL — AB (ref 0–99)
NONHDL: 165.92
Triglycerides: 84 mg/dL (ref 0.0–149.0)
VLDL: 16.8 mg/dL (ref 0.0–40.0)

## 2016-08-24 LAB — HEPATIC FUNCTION PANEL
ALK PHOS: 69 U/L (ref 39–117)
ALT: 14 U/L (ref 0–35)
AST: 20 U/L (ref 0–37)
Albumin: 4.5 g/dL (ref 3.5–5.2)
BILIRUBIN TOTAL: 0.6 mg/dL (ref 0.2–1.2)
Bilirubin, Direct: 0.1 mg/dL (ref 0.0–0.3)
Total Protein: 7.3 g/dL (ref 6.0–8.3)

## 2016-08-24 LAB — POC URINALSYSI DIPSTICK (AUTOMATED)
BILIRUBIN UA: NEGATIVE
Glucose, UA: NEGATIVE
KETONES UA: NEGATIVE
Leukocytes, UA: NEGATIVE
Nitrite, UA: NEGATIVE
PH UA: 6
SPEC GRAV UA: 1.015
Urobilinogen, UA: 0.2

## 2016-08-24 LAB — TSH: TSH: 1.74 u[IU]/mL (ref 0.35–4.50)

## 2016-08-28 ENCOUNTER — Ambulatory Visit (INDEPENDENT_AMBULATORY_CARE_PROVIDER_SITE_OTHER): Payer: BC Managed Care – PPO | Admitting: Family Medicine

## 2016-08-28 ENCOUNTER — Encounter: Payer: Self-pay | Admitting: Family Medicine

## 2016-08-28 VITALS — BP 100/70 | HR 75 | Temp 98.0°F | Ht 62.6 in | Wt 127.0 lb

## 2016-08-28 DIAGNOSIS — Z0000 Encounter for general adult medical examination without abnormal findings: Secondary | ICD-10-CM

## 2016-08-28 NOTE — Progress Notes (Signed)
Pre visit review using our clinic review tool, if applicable. No additional management support is needed unless otherwise documented below in the visit note. 

## 2016-08-28 NOTE — Progress Notes (Signed)
Subjective:     Patient ID: Rebekah Pratt, female   DOB: 18-Oct-1960, 55 y.o.   MRN: [id number]  HPI Patient seen for physical exam. Generally very healthy. She takes no regular prescription medications. She does yoga for exercise about 4 days per week. Does not consistently take calcium or vitamin D. Nonsmoker. She is followed by gynecologist yearly and has been getting yearly to every other year mammogram. No history of abnormal Pap smears. Colonoscopy up-to-date. Father had coronary disease at age 45 but had been a smoker  Past Medical History:  Diagnosis Date  . Allergy   . Chicken pox   . Mitral valve prolapse    Past Surgical History:  Procedure Laterality Date  . APPENDECTOMY  2003  . BREAST BIOPSY Bilateral 1984  . [place]  . TONSILLECTOMY AND ADENOIDECTOMY  1970    reports that she has never smoked. She has never used smokeless tobacco. She reports that she drinks about 1.2 oz of alcohol per week . She reports that she does not use drugs. family history includes Arthritis in her father; Heart disease (age of onset: 54) in her father; Hypertension in her father. Allergies  Allergen Reactions  . Penicillins Swelling  '   Review of Systems  Constitutional: Negative for activity change, appetite change, fatigue, fever and unexpected weight change.  HENT: Negative for ear pain, hearing loss, sore throat and trouble swallowing.   Eyes: Negative for visual disturbance.  Respiratory: Negative for cough and shortness of breath.   Cardiovascular: Negative for chest pain and palpitations.  Gastrointestinal: Negative for abdominal pain, blood in stool, constipation and diarrhea.  Endocrine: Negative for cold intolerance, polydipsia and polyuria.  Genitourinary: Negative for dysuria and hematuria.  Musculoskeletal: Negative for arthralgias, back pain and myalgias.  Skin: Negative for rash.  Neurological: Negative for dizziness, syncope and headaches.   Hematological: Negative for adenopathy.  Psychiatric/Behavioral: Negative for confusion and dysphoric mood.       Objective:   Physical Exam  Constitutional: She is oriented to person, place, and time. She appears well-developed and well-nourished.  HENT:  Head: Normocephalic and atraumatic.  Eyes: EOM are normal. Pupils are equal, round, and reactive to light.  Neck: Normal range of motion. Neck supple. No thyromegaly present.  Cardiovascular: Normal rate, regular rhythm and normal heart sounds.   No murmur heard. Pulmonary/Chest: Breath sounds normal. No respiratory distress. She has no wheezes. She has no rales.  Abdominal: Soft. Bowel sounds are normal. She exhibits no distension and no mass. There is no tenderness. There is no rebound and no guarding.  Musculoskeletal: Normal range of motion. She exhibits no edema.  Lymphadenopathy:    She has no cervical adenopathy.  Neurological: She is alert and oriented to person, place, and time. She displays normal reflexes. No cranial nerve deficit.  Skin: No rash noted.  Psychiatric: She has a normal mood and affect. Her behavior is normal. Judgment and thought content normal.       Assessment:     Physical exam. Generally healthy 55 year old female. Labs reviewed with no major concerns. She has elevated cholesterol but excellent HDL and overall only 1% risk of CAD event and 10 years    Plan:     -Continue regular weightbearing exercise -We discussed importance of getting regular daily calcium and vitamin D -She plans to continue GYN follow-up  Eulas Post MD [facility] at [facility]

## 2016-09-25 ENCOUNTER — Encounter: Payer: Self-pay | Admitting: Family Medicine

## 2016-10-13 ENCOUNTER — Ambulatory Visit: Payer: BC Managed Care – PPO | Admitting: Family Medicine

## 2016-10-13 ENCOUNTER — Ambulatory Visit (INDEPENDENT_AMBULATORY_CARE_PROVIDER_SITE_OTHER): Payer: BC Managed Care – PPO | Admitting: Family Medicine

## 2016-10-13 ENCOUNTER — Encounter: Payer: Self-pay | Admitting: Family Medicine

## 2016-10-13 VITALS — BP 120/80 | HR 77 | Temp 98.3°F | Ht 62.6 in | Wt 132.9 lb

## 2016-10-13 DIAGNOSIS — J069 Acute upper respiratory infection, unspecified: Secondary | ICD-10-CM

## 2016-10-13 NOTE — Progress Notes (Signed)
   HPI:  URI: -started: 5 days ago -symptoms:nasal congestion, sore throat, PND, cough - chills and subjective fever the first day of ilness -denies:fever in last few days, SOB, NVD, tooth pain -has tried: OTC natural remedies and Alk plus -sick contacts/travel/risks: no reported flu, strep or tick exposure -Hx of: allergies and tonsillectomy  ROS: See pertinent positives and negatives per HPI.  Past Medical History:  Diagnosis Date  . Allergy   . Chicken pox   . Mitral valve prolapse     Past Surgical History:  Procedure Laterality Date  . APPENDECTOMY  2003  . BREAST BIOPSY Bilateral 1984  . [place]  . TONSILLECTOMY AND ADENOIDECTOMY  1970    Family History  Problem Relation Age of Onset  . Arthritis Father   . Heart disease Father 82    CAD  . Hypertension Father   . Colon cancer Neg Hx   . Pancreatic cancer Neg Hx   . Stomach cancer Neg Hx     Social History   Social History  . Marital status: Married    Spouse name: N/A  . Number of children: N/A  . Years of education: N/A   Social History Main Topics  . Smoking status: Never Smoker  . Smokeless tobacco: Never Used  . Alcohol use 1.2 oz/week    2 Glasses of wine per week  . Drug use: No  . Sexual activity: Yes    Birth control/ protection: Post-menopausal   Other Topics Concern  . None   Social History Narrative  . None     Current Outpatient Prescriptions:  .  Bioflavonoid Products (VITAMIN C PLUS PO), Take by mouth., Disp: , Rfl:  .  Cyanocobalamin (VITAMIN B-12 PO), Take by mouth., Disp: , Rfl:  .  fluticasone (FLONASE) 50 MCG/ACT nasal spray, Place 2 sprays into both nostrils as needed for rhinitis., Disp: 9.9 g, Rfl: 5  EXAM:  Vitals:   10/13/16 1400  BP: 120/80  Pulse: 77  Temp: 98.3 F (36.8 C)    Body mass index is 23.84 kg/m.  GENERAL: vitals reviewed and listed above, alert, oriented, appears well hydrated and in no acute distress  HEENT: atraumatic,  conjunttiva clear, no obvious abnormalities on inspection of external nose and ears, normal appearance of ear canals and TMs, clear nasal congestion, mild post oropharyngeal erythema with PND, no tonsillar edema or exudate, no sinus TTP  NECK: no obvious masses on inspection  LUNGS: clear to auscultation bilaterally, no wheezes, rales or rhonchi, good air movement  CV: HRRR, no peripheral edema  MS: moves all extremities without noticeable abnormality  PSYCH: pleasant and cooperative, no obvious depression or anxiety  ASSESSMENT AND PLAN:  Discussed the following assessment and plan:  Acute upper respiratory infection  -given HPI and exam findings today, a serious infection or illness is unlikely. We discussed potential etiologies, with VURI being most likely, and advised supportive care and monitoring. We discussed treatment side effects, likely course, antibiotic misuse, transmission, and signs of developing a serious illness. -of course, we advised to return or notify a doctor immediately if symptoms worsen or persist or new concerns arise.  Declined AVS.   There are no Patient Instructions on file for this visit.  Colin Benton R., DO

## 2016-10-13 NOTE — Progress Notes (Signed)
Pre visit review using our clinic review tool, if applicable. No additional management support is needed unless otherwise documented below in the visit note. 

## 2017-01-22 ENCOUNTER — Ambulatory Visit (INDEPENDENT_AMBULATORY_CARE_PROVIDER_SITE_OTHER): Payer: BC Managed Care – PPO | Admitting: Family Medicine

## 2017-01-22 ENCOUNTER — Encounter: Payer: Self-pay | Admitting: Family Medicine

## 2017-01-22 VITALS — BP 110/70 | HR 74 | Temp 99.0°F | Wt 128.4 lb

## 2017-01-22 DIAGNOSIS — K921 Melena: Secondary | ICD-10-CM | POA: Diagnosis not present

## 2017-01-22 DIAGNOSIS — K648 Other hemorrhoids: Secondary | ICD-10-CM | POA: Diagnosis not present

## 2017-01-22 LAB — CBC WITH DIFFERENTIAL/PLATELET
BASOS ABS: 0.1 10*3/uL (ref 0.0–0.1)
Basophils Relative: 0.9 % (ref 0.0–3.0)
EOS PCT: 1.3 % (ref 0.0–5.0)
Eosinophils Absolute: 0.1 10*3/uL (ref 0.0–0.7)
HEMATOCRIT: 38.1 % (ref 36.0–46.0)
Hemoglobin: 12.7 g/dL (ref 12.0–15.0)
LYMPHS PCT: 37 % (ref 12.0–46.0)
Lymphs Abs: 2.2 10*3/uL (ref 0.7–4.0)
MCHC: 33.4 g/dL (ref 30.0–36.0)
MCV: 89.7 fl (ref 78.0–100.0)
MONOS PCT: 7.1 % (ref 3.0–12.0)
Monocytes Absolute: 0.4 10*3/uL (ref 0.1–1.0)
Neutro Abs: 3.1 10*3/uL (ref 1.4–7.7)
Neutrophils Relative %: 53.7 % (ref 43.0–77.0)
Platelets: 244 10*3/uL (ref 150.0–400.0)
RBC: 4.24 Mil/uL (ref 3.87–5.11)
RDW: 13.4 % (ref 11.5–15.5)
WBC: 5.8 10*3/uL (ref 4.0–10.5)

## 2017-01-22 MED ORDER — HYDROCORTISONE ACETATE 30 MG RE SUPP: 1.0000 | Freq: Two times a day (BID) | RECTAL | 0 refills | Status: DC

## 2017-01-22 NOTE — Progress Notes (Signed)
Pre visit review using our clinic review tool, if applicable. No additional management support is needed unless otherwise documented below in the visit note. 

## 2017-01-22 NOTE — Progress Notes (Signed)
Subjective:     Patient ID: Rebekah Pratt, female   DOB: 03/21/1961, 56 y.o.   MRN: [id number]  HPI Patient seen with intermittent blood per stool for the past 1-2 months. She's had some mostly bright blood and very small volume. Sometimes goes several days without any symptoms.No blood with wiping. She has not had any peri- anal discomfort. Denies any constipation or diarrhea. She had colonoscopy July 2015 which showed one benign polyp. This was non-adenomatous. She's not had any recent appetite or weight changes. No nausea or vomiting. No constipation or diarrhea. Occasional fleeting right lower quadrant pain but not consistently. She had normal hemoglobin last fall with physical at 13.5.  No dizziness.  Past Medical History:  Diagnosis Date  . Allergy   . Chicken pox   . Mitral valve prolapse    Past Surgical History:  Procedure Laterality Date  . APPENDECTOMY  2003  . BREAST BIOPSY Bilateral 1984  . [place]  . TONSILLECTOMY AND ADENOIDECTOMY  1970    reports that she has never smoked. She has never used smokeless tobacco. She reports that she drinks about 1.2 oz of alcohol per week . She reports that she does not use drugs. family history includes Arthritis in her father; Heart disease (age of onset: 106) in her father; Hypertension in her father. Allergies  Allergen Reactions  . Penicillins Swelling     Review of Systems  Constitutional: Negative for appetite change, chills, fever and unexpected weight change.  Respiratory: Negative for shortness of breath.   Cardiovascular: Negative for chest pain.  Gastrointestinal: Positive for blood in stool. Negative for abdominal distention, constipation, diarrhea, nausea, rectal pain and vomiting.  Neurological: Negative for dizziness.       Objective:   Physical Exam  Constitutional: She appears well-developed and well-nourished.  Cardiovascular: Normal rate and regular rhythm.   Pulmonary/Chest: Effort normal and  breath sounds normal. No respiratory distress. She has no wheezes. She has no rales.  Abdominal: Soft. She exhibits no distension and no mass. There is no tenderness. There is no rebound and no guarding.  Genitourinary:  Genitourinary Comments: No external hemorrhoids. No visible anal fissure. Digital exam reveals no mass. Trace Hemoccult positive.  Anoscopy reveals some internal hemorrhoids but no active bleeding. No other abnormalities noted.       Assessment:     Hematochezia. Patient had only one benign polyp on colonoscopy less than 3 years ago as above. Suspect probably recent bleeding related to internal hemorrhoid though no active bleeding noted at this time    Plan:     -Check CBC -Measures to reduce constipation and straining -Follow up with GI if symptoms persist -Consider hydro-cortisone 30 mg suppositories twice a day if symptoms persist  Eulas Post MD [facility] at [facility]

## 2017-01-22 NOTE — Patient Instructions (Signed)
Hemorrhoids Hemorrhoids are swollen veins in and around the rectum or anus. There are two types of hemorrhoids:  Internal hemorrhoids. These occur in the veins that are just inside the rectum. They may poke through to the outside and become irritated and painful.  External hemorrhoids. These occur in the veins that are outside of the anus and can be felt as a painful swelling or hard lump near the anus.  Most hemorrhoids do not cause serious problems, and they can be managed with home treatments such as diet and lifestyle changes. If home treatments do not help your symptoms, procedures can be done to shrink or remove the hemorrhoids. What are the causes? This condition is caused by increased pressure in the anal area. This pressure may result from various things, including:  Constipation.  Straining to have a bowel movement.  Diarrhea.  Pregnancy.  Obesity.  Sitting for long periods of time.  Heavy lifting or other activity that causes you to strain.  Anal sex.  What are the signs or symptoms? Symptoms of this condition include:  Pain.  Anal itching or irritation.  Rectal bleeding.  Leakage of stool (feces).  Anal swelling.  One or more lumps around the anus.  How is this diagnosed? This condition can often be diagnosed through a visual exam. Other exams or tests may also be done, such as:  Examination of the rectal area with a gloved hand (digital rectal exam).  Examination of the anal canal using a small tube (anoscope).  A blood test, if you have lost a significant amount of blood.  A test to look inside the colon (sigmoidoscopy or colonoscopy).  How is this treated? This condition can usually be treated at home. However, various procedures may be done if dietary changes, lifestyle changes, and other home treatments do not help your symptoms. These procedures can help make the hemorrhoids smaller or remove them completely. Some of these procedures involve  surgery, and others do not. Common procedures include:  Rubber band ligation. Rubber bands are placed at the base of the hemorrhoids to cut off the blood supply to them.  Sclerotherapy. Medicine is injected into the hemorrhoids to shrink them.  Infrared coagulation. A type of light energy is used to get rid of the hemorrhoids.  Hemorrhoidectomy surgery. The hemorrhoids are surgically removed, and the veins that supply them are tied off.  Stapled hemorrhoidopexy surgery. A circular stapling device is used to remove the hemorrhoids and use staples to cut off the blood supply to them.  Follow these instructions at home: Eating and drinking  Eat foods that have a lot of fiber in them, such as whole grains, beans, nuts, fruits, and vegetables. Ask your health care provider about taking products that have added fiber (fiber supplements).  Drink enough fluid to keep your urine clear or pale yellow. Managing pain and swelling  Take warm sitz baths for 20 minutes, 3-4 times a day to ease pain and discomfort.  If directed, apply ice to the affected area. Using ice packs between sitz baths may be helpful. ? Put ice in a plastic bag. ? Place a towel between your skin and the bag. ? Leave the ice on for 20 minutes, 2-3 times a day. General instructions  Take over-the-counter and prescription medicines only as told by your health care provider.  Use medicated creams or suppositories as told.  Exercise regularly.  Go to the bathroom when you have the urge to have a bowel movement. Do not wait.    Avoid straining to have bowel movements.  Keep the anal area dry and clean. Use wet toilet paper or moist towelettes after a bowel movement.  Do not sit on the toilet for long periods of time. This increases blood pooling and pain. Contact a health care provider if:  You have increasing pain and swelling that are not controlled by treatment or medicine.  You have uncontrolled bleeding.  You  have difficulty having a bowel movement, or you are unable to have a bowel movement.  You have pain or inflammation outside the area of the hemorrhoids. This information is not intended to replace advice given to you by your health care provider. Make sure you discuss any questions you have with your health care provider. Document Released: 09/22/2000 Document Revised: 02/23/2016 Document Reviewed: 06/09/2015 Elsevier Interactive Patient Education  2017 Elsevier Inc.  

## 2017-06-28 ENCOUNTER — Encounter: Payer: Self-pay | Admitting: Family Medicine

## 2017-10-19 ENCOUNTER — Ambulatory Visit: Payer: BC Managed Care – PPO | Admitting: Family Medicine

## 2017-10-23 ENCOUNTER — Ambulatory Visit: Payer: BC Managed Care – PPO | Admitting: Family Medicine

## 2017-10-23 ENCOUNTER — Encounter: Payer: Self-pay | Admitting: Family Medicine

## 2017-10-23 VITALS — BP 120/80 | HR 72 | Temp 98.7°F | Wt 132.6 lb

## 2017-10-23 DIAGNOSIS — R1031 Right lower quadrant pain: Secondary | ICD-10-CM

## 2017-10-23 DIAGNOSIS — R1907 Generalized intra-abdominal and pelvic swelling, mass and lump: Secondary | ICD-10-CM

## 2017-10-23 DIAGNOSIS — K769 Liver disease, unspecified: Secondary | ICD-10-CM

## 2017-10-23 LAB — POCT URINALYSIS DIPSTICK
Bilirubin, UA: NEGATIVE
Glucose, UA: NEGATIVE
KETONES UA: NEGATIVE
LEUKOCYTES UA: NEGATIVE
Nitrite, UA: NEGATIVE
PH UA: 6.5 (ref 5.0–8.0)
PROTEIN UA: NEGATIVE
Spec Grav, UA: 1.015 (ref 1.010–1.025)
UROBILINOGEN UA: 0.2 U/dL

## 2017-10-23 NOTE — Patient Instructions (Signed)
We will set up CT scan to further assess.

## 2017-10-23 NOTE — Progress Notes (Addendum)
Subjective:     Patient ID: Rebekah Pratt, female   DOB: [birth year], 57 y.o.   MRN: [id number]  HPI Patient seen with approximately one-year history of somewhat progressive right lower abdominal to pelvic pressure sensation. She states that she notices more at night especially when she lays on her left side. She had colonoscopy 2015 with 1 polyp removed with recommended five-year follow-up. No recent change in bowel habits. Appetite and weight are stable. She denies any urinary symptoms. Occasional pain radiating toward the right flank region. No alleviating factors.   She describes this as more of a "pressure" sensation than actual pain. She's been postmenopausal about 6 years. No vaginal spotting. She still has both ovaries. She had appendectomy around 2003. Denies any fevers, chills, night sweats. No dysuria  Past Medical History:  Diagnosis Date  . Allergy   . Chicken pox   . Mitral valve prolapse    Past Surgical History:  Procedure Laterality Date  . APPENDECTOMY  2003  . BREAST BIOPSY Bilateral 1984  . [place]  . TONSILLECTOMY AND ADENOIDECTOMY  1970    reports that  has never smoked. she has never used smokeless tobacco. She reports that she drinks about 1.2 oz of alcohol per week. She reports that she does not use drugs. family history includes Arthritis in her father; Heart disease (age of onset: 18) in her father; Hypertension in her father. Allergies  Allergen Reactions  . Penicillins Swelling     Review of Systems  Constitutional: Negative for appetite change, chills, fever and unexpected weight change.  Respiratory: Negative for cough and shortness of breath.   Cardiovascular: Negative for chest pain.  Gastrointestinal: Positive for abdominal pain. Negative for abdominal distention, blood in stool, diarrhea, nausea and vomiting.  Genitourinary: Positive for pelvic pain. Negative for dysuria, hematuria, vaginal bleeding, vaginal discharge and vaginal  pain.  Skin: Negative for rash.  Hematological: Negative for adenopathy.       Objective:   Physical Exam  Constitutional: She appears well-developed and well-nourished.  Cardiovascular: Normal rate and regular rhythm.  Pulmonary/Chest: Effort normal and breath sounds normal. No respiratory distress. She has no wheezes. She has no rales.  Abdominal: Soft. She exhibits no mass. There is no rebound and no guarding.  Minimal tenderness right lower quadrant to deep palpation. No guarding or rebound. No masses palpated.  Musculoskeletal: She exhibits no edema.  Skin: No rash noted.       Assessment:     Patient presents with one year history of some persistent and possibly slightly progressive pressure sensation right lower abdomen and pelvic region. She's had previous appendectomy. Nonfocal exam.  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  Additional 7 mm nonspecific low-attenuation RIGHT lobe liver lesion.  These findings could be definitively evaluated by MR imaging with and without Eovist contrast.  Remainder of exam unremarkable  Pt was notified of above results and setting up  MRI of liver to further assess.  Eulas Post MD [facility] at [facility]   1. Multiple hypervascular liver lesions identified. The dominant lesion measures up to 6.4 cm on today's study, similar to prior  CT scan of 3 months ago. This interval stability is reassuring, but multiple additional lesions are evident by MR that were not clearly seen on CT scan. Imaging features today are again suggestive of [place] as etiology but not diagnostic and some of the smaller lesions cannot be definitively characterized. Given the large size of the dominant lesion, repeat MRI with Eovist contrast agent is recommended at this time to assess for the presence of intralesional hepatocytes which could confirm the diagnosis of FNH.  Pt notified of results. 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  Pt agrees.  Eulas Post MD [facility] at [facility]

## 2017-10-24 ENCOUNTER — Encounter: Payer: Self-pay | Admitting: Family Medicine

## 2017-10-24 LAB — COMPREHENSIVE METABOLIC PANEL
ALT: 14 U/L (ref 0–35)
AST: 23 U/L (ref 0–37)
Albumin: 4.4 g/dL (ref 3.5–5.2)
Alkaline Phosphatase: 69 U/L (ref 39–117)
BUN: 12 mg/dL (ref 6–23)
CALCIUM: 9.6 mg/dL (ref 8.4–10.5)
CHLORIDE: 101 meq/L (ref 96–112)
CO2: 31 meq/L (ref 19–32)
CREATININE: 0.82 mg/dL (ref 0.40–1.20)
GFR: 76.37 mL/min (ref >60.00)
Glucose, Bld: 85 mg/dL (ref 70–99)
POTASSIUM: 4 meq/L (ref 3.5–5.1)
Sodium: 138 mEq/L (ref 135–145)
Total Bilirubin: 0.6 mg/dL (ref 0.2–1.2)
Total Protein: 7.4 g/dL (ref 6.0–8.3)

## 2017-10-24 LAB — CBC WITH DIFFERENTIAL/PLATELET
BASOS PCT: 0.9 % (ref 0.0–3.0)
Basophils Absolute: 0 10*3/uL (ref 0.0–0.1)
EOS ABS: 0.1 10*3/uL (ref 0.0–0.7)
Eosinophils Relative: 2.1 % (ref 0.0–5.0)
HCT: 39.5 % (ref 36.0–46.0)
Hemoglobin: 12.9 g/dL (ref 12.0–15.0)
Lymphocytes Relative: 36.8 % (ref 12.0–46.0)
Lymphs Abs: 2.1 10*3/uL (ref 0.7–4.0)
MCHC: 32.8 g/dL (ref 30.0–36.0)
MCV: 92.5 fl (ref 78.0–100.0)
MONO ABS: 0.4 10*3/uL (ref 0.1–1.0)
Monocytes Relative: 7.7 % (ref 3.0–12.0)
Neutro Abs: 3 10*3/uL (ref 1.4–7.7)
Neutrophils Relative %: 52.5 % (ref 43.0–77.0)
PLATELETS: 263 10*3/uL (ref 150.0–400.0)
RBC: 4.27 Mil/uL (ref 3.87–5.11)
RDW: 13.1 % (ref 11.5–15.5)
WBC: 5.7 10*3/uL (ref 4.0–10.5)

## 2017-10-25 ENCOUNTER — Ambulatory Visit: Payer: BC Managed Care – PPO | Admitting: Family Medicine

## 2017-10-30 ENCOUNTER — Inpatient Hospital Stay: Admission: RE | Admit: 2017-10-30 | Payer: BC Managed Care – PPO | Source: Ambulatory Visit

## 2017-11-01 ENCOUNTER — Ambulatory Visit (INDEPENDENT_AMBULATORY_CARE_PROVIDER_SITE_OTHER)
Admission: RE | Admit: 2017-11-01 | Discharge: 2017-11-01 | Disposition: A | Discharge status: Home / Self Care | Payer: BC Managed Care – PPO | Source: Ambulatory Visit | Attending: Family Medicine | Admitting: Family Medicine

## 2017-11-01 DIAGNOSIS — R1031 Right lower quadrant pain: Secondary | ICD-10-CM

## 2017-11-01 MED ORDER — IOPAMIDOL (ISOVUE-300) INJECTION 61%
100.0000 mL | Freq: Once | INTRAVENOUS | Status: AC | PRN
  Administered 2017-11-01: 100 mL via INTRAVENOUS

## 2017-11-04 NOTE — Addendum Note (Signed)
Addended by: Eulas Post on: 11/04/2017 10:25 PM   Modules accepted: Orders

## 2017-11-12 ENCOUNTER — Encounter: Payer: Self-pay | Admitting: Family Medicine

## 2017-11-13 ENCOUNTER — Other Ambulatory Visit: Payer: BC Managed Care – PPO

## 2018-01-15 ENCOUNTER — Telehealth: Payer: Self-pay | Admitting: Family Medicine

## 2018-01-15 ENCOUNTER — Encounter: Payer: Self-pay | Admitting: Family Medicine

## 2018-01-15 DIAGNOSIS — R945 Abnormal results of liver function studies: Secondary | ICD-10-CM

## 2018-01-15 DIAGNOSIS — R1907 Generalized intra-abdominal and pelvic swelling, mass and lump: Secondary | ICD-10-CM

## 2018-01-15 NOTE — Telephone Encounter (Signed)
Copied from [place] [phone]. Topic: Referral - Request >> Jan 15, 2018  9:25 AM Lennox Solders wrote: Reason for CRM: pt would like to proceed with getting mri of her liver. Pt cancel appt in feb

## 2018-01-16 NOTE — Telephone Encounter (Signed)
New order placed

## 2018-01-22 ENCOUNTER — Ambulatory Visit
Admission: RE | Admit: 2018-01-22 | Discharge: 2018-01-22 | Disposition: A | Discharge status: Home / Self Care | Payer: BC Managed Care – PPO | Source: Ambulatory Visit | Attending: Family Medicine | Admitting: Family Medicine

## 2018-01-22 DIAGNOSIS — R1907 Generalized intra-abdominal and pelvic swelling, mass and lump: Secondary | ICD-10-CM

## 2018-01-22 MED ORDER — GADOBENATE DIMEGLUMINE 529 MG/ML IV SOLN
11.0000 mL | Freq: Once | INTRAVENOUS | Status: AC | PRN
  Administered 2018-01-22: 11 mL via INTRAVENOUS

## 2018-01-28 NOTE — Addendum Note (Signed)
Addended by: Eulas Post on: 01/28/2018 09:54 PM   Modules accepted: Orders

## 2018-02-25 ENCOUNTER — Other Ambulatory Visit: Payer: BC Managed Care – PPO

## 2018-02-27 ENCOUNTER — Ambulatory Visit
Admission: RE | Admit: 2018-02-27 | Discharge: 2018-02-27 | Disposition: A | Discharge status: Home / Self Care | Payer: BC Managed Care – PPO | Source: Ambulatory Visit | Attending: Family Medicine | Admitting: Family Medicine

## 2018-02-27 DIAGNOSIS — K769 Liver disease, unspecified: Secondary | ICD-10-CM

## 2018-02-27 MED ORDER — GADOXETATE DISODIUM 0.25 MMOL/ML IV SOLN
6.0000 mL | Freq: Once | INTRAVENOUS | Status: AC | PRN
  Administered 2018-02-27: 6 mL via INTRAVENOUS

## 2018-03-01 ENCOUNTER — Other Ambulatory Visit: Payer: BC Managed Care – PPO

## 2018-05-03 ENCOUNTER — Ambulatory Visit: Payer: BC Managed Care – PPO | Admitting: Family Medicine

## 2018-05-03 ENCOUNTER — Encounter: Payer: Self-pay | Admitting: Family Medicine

## 2018-05-03 VITALS — BP 122/80 | HR 73 | Temp 98.3°F | Ht 62.6 in | Wt 126.2 lb

## 2018-05-03 DIAGNOSIS — K7689 Other specified diseases of liver: Secondary | ICD-10-CM | POA: Insufficient documentation

## 2018-05-03 DIAGNOSIS — L309 Dermatitis, unspecified: Secondary | ICD-10-CM | POA: Diagnosis not present

## 2018-05-03 DIAGNOSIS — M1812 Unilateral primary osteoarthritis of first carpometacarpal joint, left hand: Secondary | ICD-10-CM

## 2018-05-03 MED ORDER — DESOXIMETASONE 0.25 % EX CREA
TOPICAL_CREAM | CUTANEOUS | 1 refills | Status: DC
Start: 2018-05-03 — End: 2020-04-30

## 2018-05-03 NOTE — Patient Instructions (Signed)
Let me know in two weeks if not improved.

## 2018-05-03 NOTE — Progress Notes (Signed)
  Subjective:     Patient ID: Rebekah Pratt, female   DOB: [birth year], 57 y.o.   MRN: [id number]  HPI Patient seen for the following items  Right hand rash. First noted couple months ago. Slowly progressing. First noted web space between fourth and fifth digit. She now has some moderate involvement third and fourth digit. No left hand involvement. She's tried multiple things including Polysporin, coconut oil, and moisturizing lotion without improvement. Mild burning. Minimal pruritus. No other generalized rashes. She does have reported history of atopic dermatitis in childhood.   Left thumb pain. Location is CMC joint. Intermittent. No visible swelling. No injury.  Patient recent abnormal CT abdomen scan last January. She had liver mass and subsequent MRI scan which showed concern for probable focal nodular hyperplasia. She ended up with second MRI scan with Eovist contrast and this confirmed likely focal nodular hyperplasia. She denies any abdominal pain. Appetite and weight stable.   Past Medical History:  Diagnosis Date  . Allergy   . Chicken pox   . Mitral valve prolapse    Past Surgical History:  Procedure Laterality Date  . APPENDECTOMY  2003  . BREAST BIOPSY Bilateral 1984  . [place]  . TONSILLECTOMY AND ADENOIDECTOMY  1970    reports that she has never smoked. She has never used smokeless tobacco. She reports that she drinks about 1.2 oz of alcohol per week. She reports that she does not use drugs. family history includes Arthritis in her father; Heart disease (age of onset: 28) in her father; Hypertension in her father. Allergies  Allergen Reactions  . Penicillins Swelling       Review of Systems  Constitutional: Negative for chills and fever.  Respiratory: Negative for shortness of breath.   Cardiovascular: Negative for chest pain.  Gastrointestinal: Negative for abdominal pain.  Musculoskeletal: Positive for arthralgias.  Skin: Positive for rash.        Objective:   Physical Exam  Constitutional: She appears well-developed and well-nourished.  Cardiovascular: Normal rate.  Pulmonary/Chest: Effort normal.  Musculoskeletal:  Patient has minimal tenderness involving the left CMC joint. Mild crepitus with movement. No erythema. No warmth. No ecchymosis.  Skin: Rash noted.  Patient has dry slightly scaly rash mostly web space between fourth and fifth digits. 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 Consider topical diclofenac if symptoms worsen -We discussed results of her recent multiple imaging studies. No further evaluation this time  Eulas Post MD [facility] at [facility]

## 2018-08-08 LAB — HM MAMMOGRAPHY

## 2019-04-16 ENCOUNTER — Telehealth: Payer: Self-pay

## 2019-04-16 NOTE — Telephone Encounter (Signed)
Should be 10 years from 2015- based on fact poly was not adenomatous.

## 2019-04-16 NOTE — Telephone Encounter (Signed)
Please see message. Please advise.  Copied from [place] [id number]. Topic: General - Other >> Apr 16, 2019  7:22 AM Keene Breath wrote: Reason for CRM: Patient to ask the doctor if it is time to schedule her for a colonoscopy.  CB# [phone]

## 2019-04-19 ENCOUNTER — Encounter: Payer: Self-pay | Admitting: Gastroenterology

## 2019-04-21 NOTE — Telephone Encounter (Signed)
Called patient and gave her the message from Dr. Elease Hashimoto. Patient stated that she received a MyChart message from her gastro doctor that stated it was time for her colonoscopy and I advised that she call their office to find out for sure. Patient stated that she has had some symptom changes and I advised that she should contact her gastro and let them know in case she needs to have a visit with them. Patient verbalized an understanding and stated that she would reach out to them.

## 2019-05-09 ENCOUNTER — Encounter: Payer: Self-pay | Admitting: Family Medicine

## 2019-05-09 ENCOUNTER — Encounter: Payer: Self-pay | Admitting: Gastroenterology

## 2019-05-09 ENCOUNTER — Ambulatory Visit (INDEPENDENT_AMBULATORY_CARE_PROVIDER_SITE_OTHER): Payer: BC Managed Care – PPO | Admitting: Family Medicine

## 2019-05-09 ENCOUNTER — Other Ambulatory Visit: Payer: Self-pay

## 2019-05-09 VITALS — BP 112/72 | HR 68 | Temp 97.7°F | Ht 62.25 in | Wt 128.8 lb

## 2019-05-09 DIAGNOSIS — M25561 Pain in right knee: Secondary | ICD-10-CM

## 2019-05-09 DIAGNOSIS — R1031 Right lower quadrant pain: Secondary | ICD-10-CM | POA: Diagnosis not present
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
## 2019-05-09 NOTE — Patient Instructions (Signed)
Let me know if 3-4 weeks if knee not improving  Hopefully this still represents a strain or small meniscus tear which will hear.

## 2019-06-05 ENCOUNTER — Ambulatory Visit (AMBULATORY_SURGERY_CENTER): Payer: Self-pay

## 2019-06-05 ENCOUNTER — Other Ambulatory Visit: Payer: Self-pay

## 2019-06-05 VITALS — Temp 95.9°F | Ht 62.0 in | Wt 125.0 lb

## 2019-06-05 DIAGNOSIS — Z86010 Personal history of colonic polyps: Secondary | ICD-10-CM

## 2019-06-05 MED ORDER — SUPREP BOWEL PREP KIT 17.5-3.13-1.6 GM/177ML PO SOLN: 1.0000 | Freq: Once | ORAL | 0 refills | Status: AC

## 2019-06-05 NOTE — Progress Notes (Signed)
Per pt, no allergies to soy or egg products.Pt not taking any weight loss meds or using  O2 at home.  Pt states she gets agitated after sedation ( one time only)   Pt refused emmi video.

## 2019-06-20 ENCOUNTER — Encounter: Payer: Self-pay | Admitting: Gastroenterology

## 2019-06-21 ENCOUNTER — Encounter: Payer: BC Managed Care – PPO | Admitting: Gastroenterology

## 2019-07-05 ENCOUNTER — Encounter: Payer: BC Managed Care – PPO | Admitting: Gastroenterology

## 2019-10-13 ENCOUNTER — Telehealth (INDEPENDENT_AMBULATORY_CARE_PROVIDER_SITE_OTHER): Payer: BC Managed Care – PPO | Admitting: Family Medicine

## 2019-10-13 ENCOUNTER — Other Ambulatory Visit: Payer: Self-pay

## 2019-10-13 DIAGNOSIS — B029 Zoster without complications: Secondary | ICD-10-CM | POA: Diagnosis not present

## 2019-10-13 NOTE — Progress Notes (Signed)
This visit type was conducted due to national recommendations for restrictions regarding the COVID-19 pandemic in an effort to limit this patient's exposure and mitigate transmission in our community.   Virtual Visit via Video Note  I connected with Rebekah Pratt on 10/13/19 at 11:45 AM EST by a video enabled telemedicine application and verified that I am speaking with the correct person using two identifiers.  Location patient: home Location provider:work or home office Persons participating in the virtual visit: patient, provider  I discussed the limitations of evaluation and management by telemedicine and the availability of in person appointments. The patient expressed understanding and agreed to proceed.   HPI: Rebekah Pratt relates a rash which started just above her left iliac crest on the backside on December 30.  She noticed a burning sensation followed by some erythema and what she described as a "whelp ".  She is not had any further rash or new rash since then.  Her pain is somewhat intermittent.  No itching.  She has used Tylenol and CBD cream which seemed to be offering some relief.  Her pain is relatively mild.  She does have what appears to be a small cluster of vesicles near the center.  Denies prior history of shingles.   ROS: See pertinent positives and negatives per HPI.  Past Medical History:  Diagnosis Date  . Allergy   . Chicken pox   . Complication of anesthesia    sedation cause agitation/ only one time/ after D and C  . Mitral valve prolapse     Past Surgical History:  Procedure Laterality Date  . APPENDECTOMY  2003  . BREAST BIOPSY Bilateral 1984   benign  . DILATION AND CURETTAGE OF UTERUS    . [place]   pt unsure of which eye  . TONSILLECTOMY AND ADENOIDECTOMY  1970    Family History  Problem Relation Age of Onset  . Arthritis Father   . Heart disease Father 36       CAD  . Hypertension Father   . Healthy Brother   . Colon cancer Neg  Hx   . Pancreatic cancer Neg Hx   . Stomach cancer Neg Hx     SOCIAL HX: Non-smoker   Current Outpatient Medications:  .  APPLE CIDER VINEGAR PO, Take by mouth. Drink 1 tsp weekly, Disp: , Rfl:  .  Ascorbic Acid (VITAMIN C) 1000 MG tablet, Take by mouth daily. , Disp: , Rfl:  .  B Complex Vitamins (VITAMIN B COMPLEX PO), Take by mouth daily. , Disp: , Rfl:  .  Bioflavonoid Products (VITAMIN C PLUS PO), Take by mouth., Disp: , Rfl:  .  Calcium Carb-Cholecalciferol (CALCIUM-VITAMIN D) 500-200 MG-UNIT tablet, Take by mouth daily. , Disp: , Rfl:  .  cholecalciferol (VITAMIN D) 1000 units tablet, Take 1,000 Units by mouth daily., Disp: , Rfl:  .  coconut oil OIL, Apply 1 application topically as needed., Disp: , Rfl:  .  Cyanocobalamin (VITAMIN B-12 PO), Take by mouth., Disp: , Rfl:  .  desoximetasone (TOPICORT) 0.25 % cream, Use on affected rash bid prn with no longer than 2 weeks of continuous use. (Patient not taking: Reported on 06/05/2019), Disp: 30 g, Rfl: 1 .  fluticasone (FLONASE) 50 MCG/ACT nasal spray, Place 2 sprays into both nostrils as needed for rhinitis. (Patient not taking: Reported on 06/05/2019), Disp: 9.9 g, Rfl: 5 .  HONEY PO, Take by mouth. Take honey with apple cider vinegar weekly, Disp: ,  Rfl:  .  milk thistle 175 MG tablet, Take 175 mg by mouth daily., Disp: , Rfl:   EXAM:  VITALS per patient if applicable:  GENERAL: alert, oriented, appears well and in no acute distress  HEENT: atraumatic, conjunttiva clear, no obvious abnormalities on inspection of external nose and ears  NECK: normal movements of the head and neck  LUNGS: on inspection no signs of respiratory distress, breathing rate appears normal, no obvious gross SOB, gasping or wheezing  CV: no obvious cyanosis  MS: moves all visible extremities without noticeable abnormality  PSYCH/NEURO: pleasant and cooperative, no obvious depression or anxiety, speech and thought processing grossly  intact  ASSESSMENT AND PLAN:  Discussed the following assessment and plan:  Herpes zoster without complication   -Given onset almost a week ago we have not recommended Valtrex -Keep clean with soap and water -Continue Tylenol as needed for discomfort.  We explained there are other options for treatment of pain if this becomes worse such as gabapentin but at this point she is coping fairly well -Follow-up as needed     I discussed the assessment and treatment plan with the patient. The patient was provided an opportunity to ask questions and all were answered. The patient agreed with the plan and demonstrated an understanding of the instructions.   The patient was advised to call back or seek an in-person evaluation if the symptoms worsen or if the condition fails to improve as anticipated.    Carolann Littler, MD

## 2019-10-17 ENCOUNTER — Telehealth: Payer: Self-pay | Admitting: Family Medicine

## 2019-10-17 NOTE — Telephone Encounter (Signed)
Pt has been taking 2000Mg s of tylenol per day to help with the pain   Pt had shingles on her back left hip but now she has it on her belly , her left leg, the crease of groin on the left side and is in severe pain/ Pt wanted to advise Dr. Elease Hashimoto of this to see if he would send in Gabapentin for the pain as mentioned in her AVS from appt / please advise if medication can be called in today

## 2019-10-17 NOTE — Telephone Encounter (Signed)
Please see message. °

## 2019-10-17 NOTE — Telephone Encounter (Signed)
Message Routed to PCP CMA 

## 2019-10-19 NOTE — Telephone Encounter (Signed)
Please apologize to pt for me,  I did not see this message until today.  May start Gabapentin 300 mg po qhs and may increase by one tablet daily up to one po TID. #90 with one refill.  This may cause some sedation and can cause some dizziness.

## 2019-10-20 ENCOUNTER — Other Ambulatory Visit: Payer: Self-pay

## 2019-10-20 MED ORDER — GABAPENTIN 300 MG PO CAPS: 300.0000 mg | ORAL_CAPSULE | Freq: Every day | ORAL | 1 refills | Status: DC

## 2019-10-20 NOTE — Telephone Encounter (Signed)
Called patient and gave her the message from Dr. Elease Hashimoto and let her know that the medication has been sent to her [place]. Patient verbalized an understanding.

## 2019-12-23 ENCOUNTER — Other Ambulatory Visit: Payer: Self-pay

## 2019-12-23 ENCOUNTER — Ambulatory Visit: Payer: BC Managed Care – PPO | Attending: Internal Medicine

## 2019-12-23 DIAGNOSIS — Z20822 Contact with and (suspected) exposure to covid-19: Secondary | ICD-10-CM

## 2019-12-24 LAB — NOVEL CORONAVIRUS, NAA: SARS-CoV-2, NAA: NOT DETECTED

## 2019-12-28 IMAGING — MR MR ABDOMEN WO/W CM
11 of 17 series · 25 of 48 positions shown · IV contrast (12 ml multihance)
Comparison: 11/01/2017

CLINICAL DATA: Liver mass on recent CT scan.

EXAM:
MRI ABDOMEN WITHOUT AND WITH CONTRAST
TECHNIQUE: Multiplanar multisequence MR imaging of the abdomen was performed
both before and after the administration of intravenous contrast.
CONTRAST:  11mL MULTIHANCE GADOBENATE DIMEGLUMINE 529 MG/ML IV SOLN

[Series 3: cor haste · coronal · 5.0mm · 0.74mm/px · 1 of 32 slices shown]
[im 1/32]
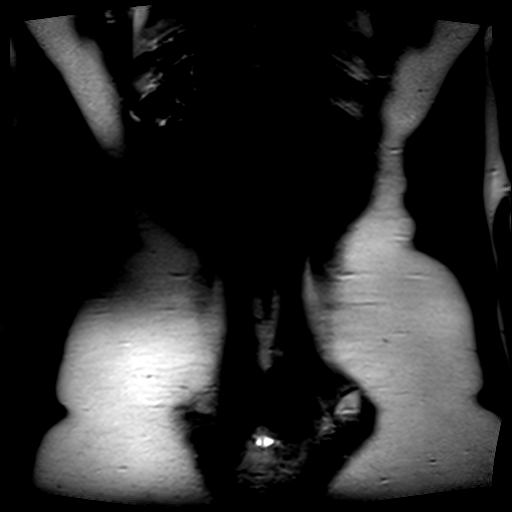

[Series 4: T1 · axial · 6.0mm · 0.74mm/px · z∈[-104,+120]mm · 2 of 70 slices shown]
[im 1/70]
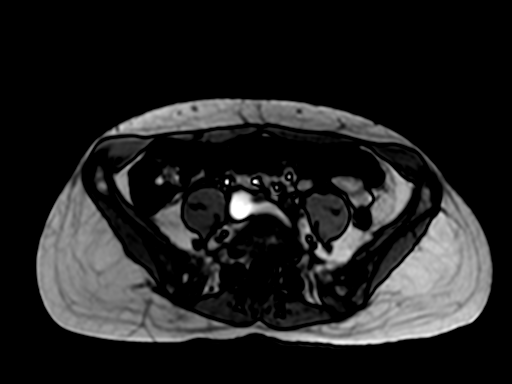
[im 70/70]
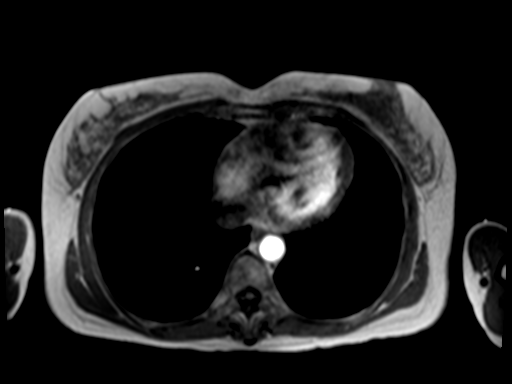

[Series 5: axial haste · axial · 6.0mm · 0.74mm/px · 1 of 35 slices shown]
[im 1/35]
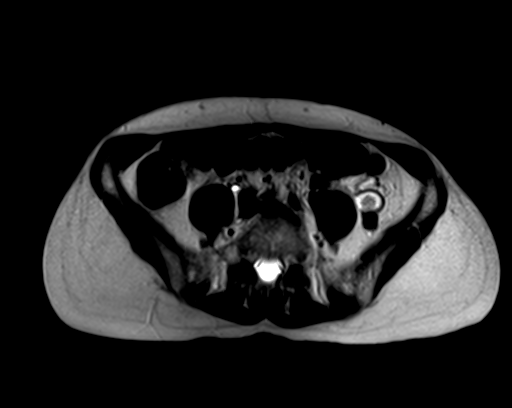

[Series 6: T2 · axial · 6.0mm · 1.12mm/px · 1 of 30 slices shown]
[im 1/30]
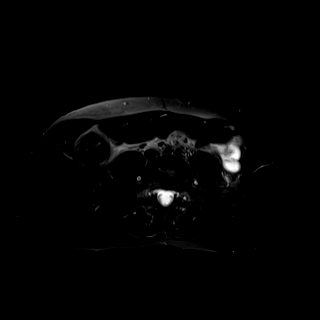

[Series 7: [id number]_trig · axial · 6.0mm · 1.98mm/px · z∈[-65,+144]mm · 3 of 90 slices shown]
[im 1/90]
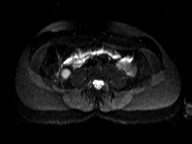
[im 45/90]
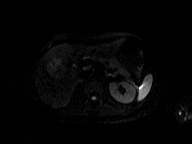
[im 90/90]
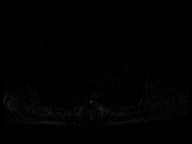

[Series 8: [id number]_trig_adc · axial · 6.0mm · 1.98mm/px · 1 of 30 slices shown]
[im 1/30]
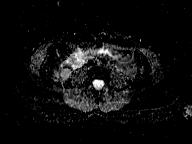

[Series 9: bSSFP · axial · 5.0mm · 0.74mm/px · 1 of 40 slices shown]
[im 1/40]
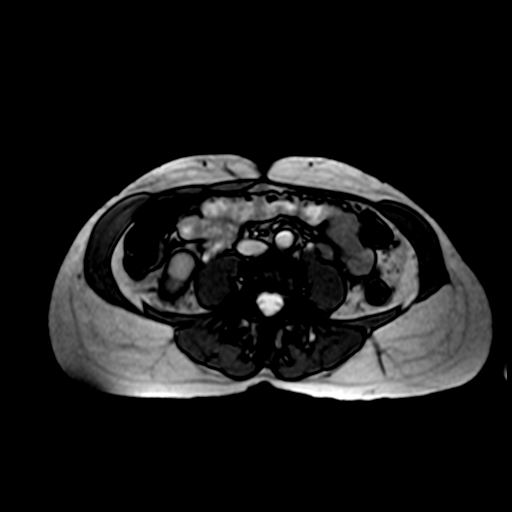

[Series 10: T1 dynamic · axial · non-contrast · 2.5mm · 0.74mm/px · z∈[-97,+121]mm · 3 of 88 slices shown]
[im 1/88]
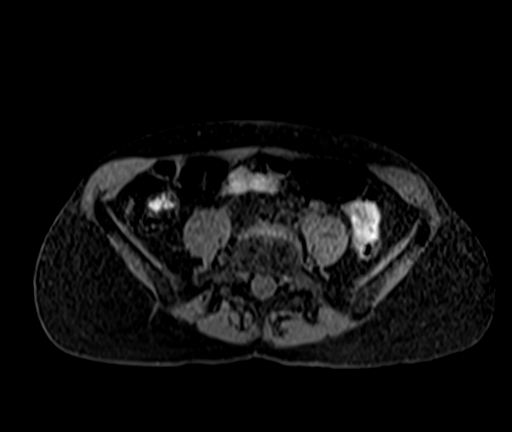
[im 44/88]
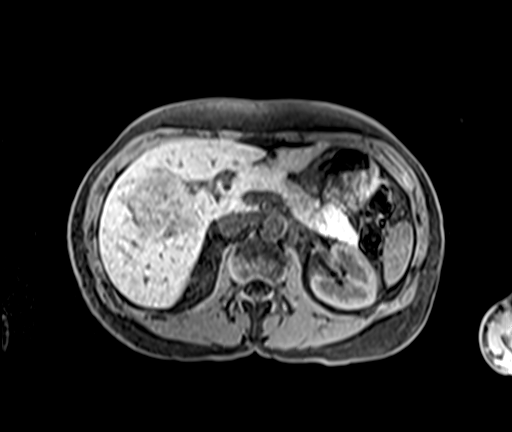
[im 88/88]
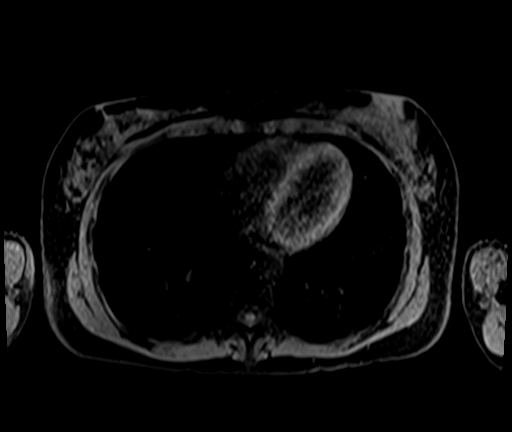

[Series 11: T1 dynamic post-contrast · axial · 2.5mm · 0.74mm/px · z∈[-97,+121]mm · 4 of 88 slices shown (1 of 3)]
[im 1/88]
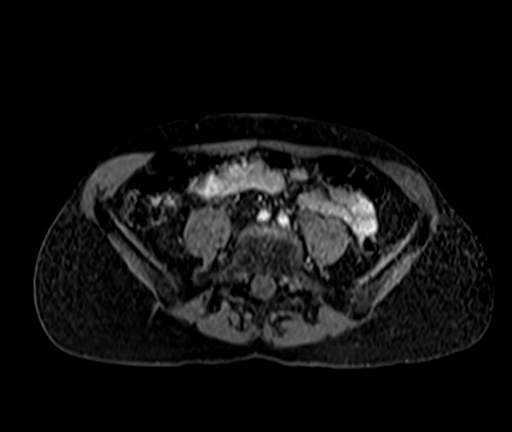
[im 30/88]
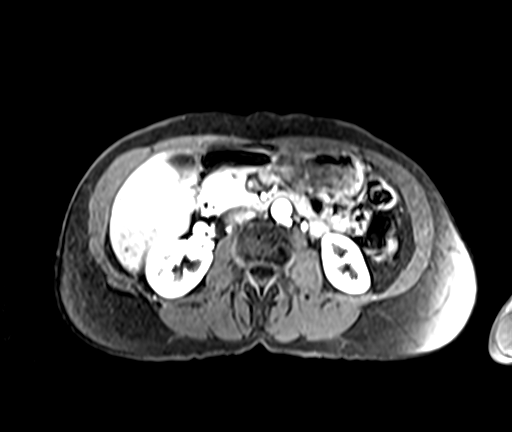
[im 59/88]
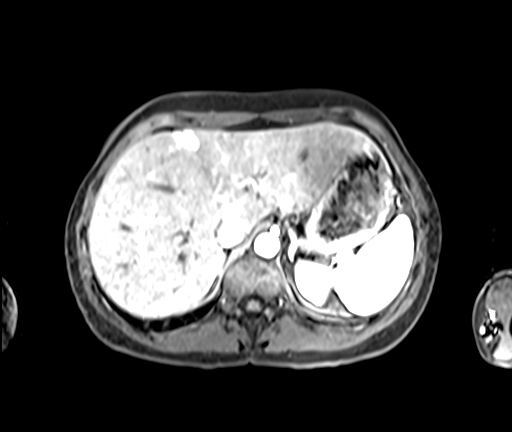
[im 88/88]
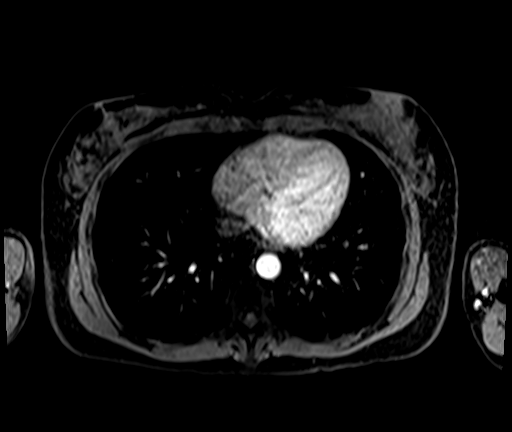

[Series 12: T1 dynamic post-contrast · axial · 2.5mm · 0.74mm/px · z∈[-97,+121]mm · 4 of 88 slices shown (2 of 3)]
[im 1/88]
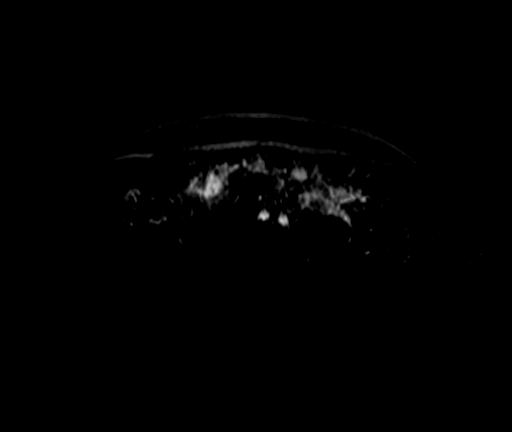
[im 30/88]
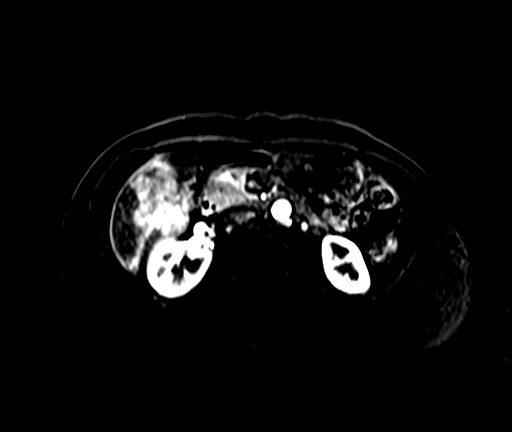
[im 59/88]
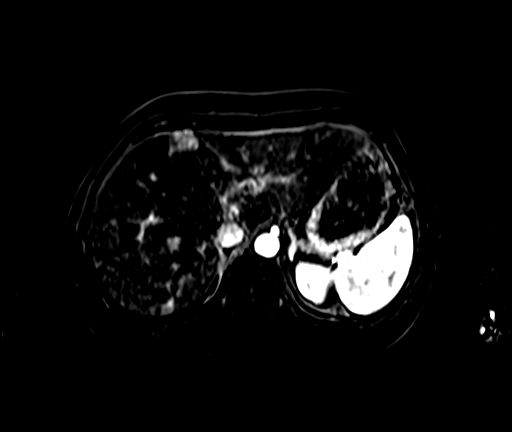
[im 88/88]
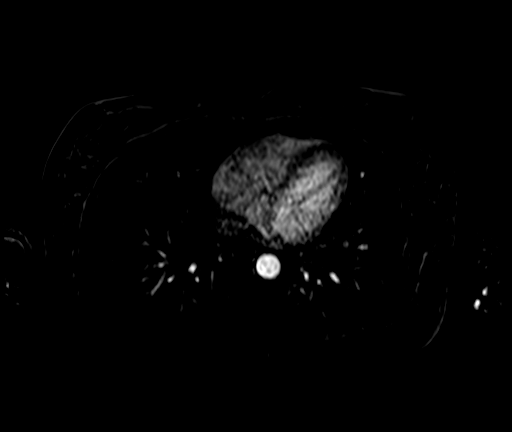

[Series 13: T1 dynamic post-contrast · axial · 2.5mm · 0.74mm/px · z∈[-97,+121]mm · 4 of 88 slices shown (3 of 3)]
[im 1/88]
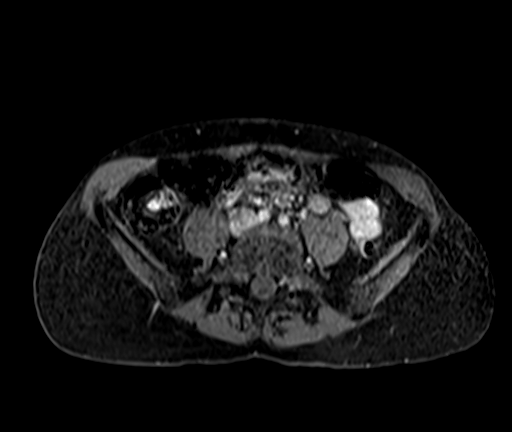
[im 30/88]
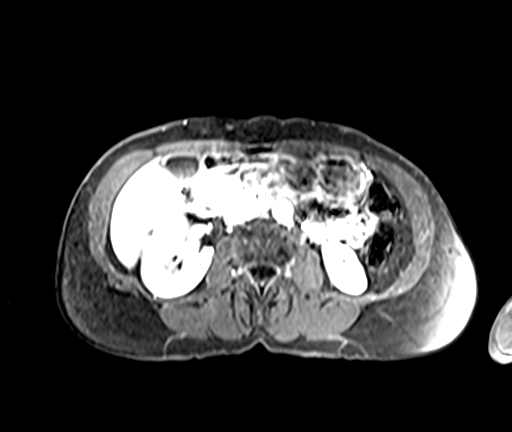
[im 59/88]
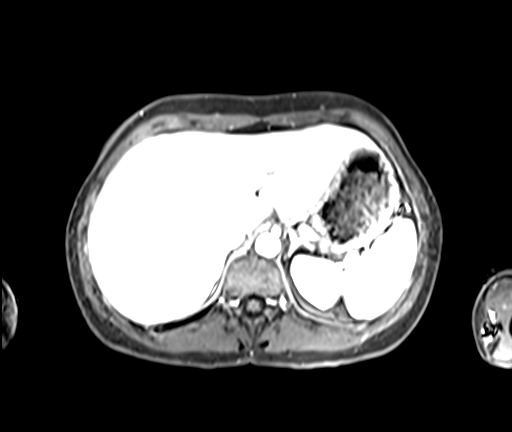
[im 88/88]
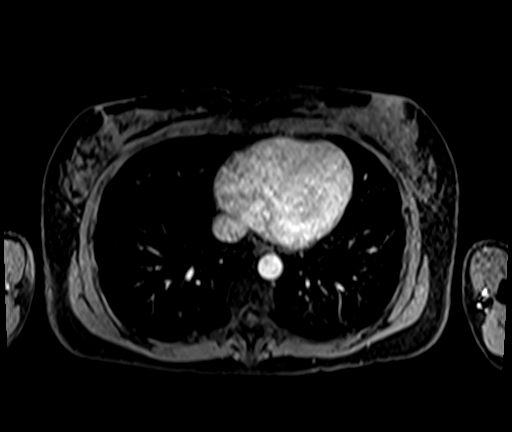

[25 of 48 positions shown; findings below may reference images not displayed]

FINDINGS: Lower chest: Unremarkable.

Hepatobiliary: Multiple hypervascular lesions are identified in the
liver. The dominant lesion measures 6.4 x 6.3 cm and segment V
(image 53/series 11). This lesion shows subtle hypointensity on T1
imaging and subtle hyperintensity on T2 imaging without evidence for
restricted diffusion. There is brisk enhancement after IV contrast
administration with washout similar to background liver parenchyma.
Large central scar evident.

Additional hypervascular lesions are identified.

-1.6 cm subcapsular hyperenhancing lesion in segment IV ([DATE]).

-1.5 cm subcapsular hyperenhancing lesion posterior right liver
segment VII (32/11)

-3.7 cm hypervascular lesion in segment IV (38/11).

-7 mm hypervascular lesion in segment VII towards the dome ([DATE]).

-tiny hypervascular foci in the lateral segment left liver (images
35, 48/11).

Pancreas: No focal mass lesion. No dilatation of the main duct. No
intraparenchymal cyst. No peripancreatic edema.

Spleen:  No splenomegaly. No focal mass lesion.

Adrenals/Urinary Tract: No adrenal nodule or mass. Kidneys are
unremarkable.

Stomach/Bowel: Stomach is nondistended. No gastric wall thickening.
No evidence of outlet obstruction. Duodenum is normally positioned
as is the ligament of Treitz. No small bowel or colonic dilatation.

Vascular/Lymphatic: No abdominal aortic aneurysm. No abdominal
aortic atherosclerotic calcification. No abdominal lymphadenopathy.

Other:  No intraperitoneal free fluid.

Musculoskeletal: No abnormal marrow enhancement within the
visualized bony anatomy.
IMPRESSION: 1. Multiple hypervascular liver lesions identified. The dominant
lesion measures up to 6.4 cm on today's study, similar to prior CT
scan of 3 months ago. This interval stability is reassuring, but
multiple additional lesions are evident by MR that were not clearly
seen on CT scan. Imaging features today are again suggestive of FNH
as etiology but not diagnostic and some of the smaller lesions
cannot be definitively characterized. Given the large size of the
dominant lesion, repeat MRI with Eovist contrast agent is
recommended at this time to assess for the presence of intralesional
hepatocytes which could confirm the diagnosis of FNH.

I discussed these findings by telephone with Dr. Harini on the
morning of 01/23/2018.

## 2020-03-26 ENCOUNTER — Encounter: Payer: BC Managed Care – PPO | Admitting: Family Medicine

## 2020-04-30 ENCOUNTER — Ambulatory Visit (INDEPENDENT_AMBULATORY_CARE_PROVIDER_SITE_OTHER): Payer: BC Managed Care – PPO | Admitting: Family Medicine

## 2020-04-30 ENCOUNTER — Other Ambulatory Visit: Payer: Self-pay

## 2020-04-30 ENCOUNTER — Encounter: Payer: Self-pay | Admitting: Family Medicine

## 2020-04-30 VITALS — BP 110/60 | HR 64 | Temp 97.6°F | Ht 62.0 in | Wt 128.2 lb

## 2020-04-30 DIAGNOSIS — Z0000 Encounter for general adult medical examination without abnormal findings: Secondary | ICD-10-CM

## 2020-04-30 NOTE — Progress Notes (Signed)
Established Patient Office Visit  Subjective:  Patient ID: Rebekah Pratt, female    DOB: [birth year]  Age: 59 y.o. MRN: [id number]  CC:  Chief Complaint  Patient presents with  . Annual Exam    HPI Rebekah Pratt presents for physical exam.  She does not take any regular medications.  She sees GYN regularly.  She walks some for exercise but not consistently.  Several health maintenance issues addressed  -No hepatitis C screening here but she donates blood regularly -She has due for repeat colonoscopy and she wishes to get this down in [place].  She apparently has had polyps on colonoscopy 2015 with recommended 5-year follow-up. -No history of shingles vaccine -Date of last tetanus unknown.  She thinks this was less than 10 years ago when she was in [place] prior to moving here 7 years ago -She sees GYN regularly for mammograms and Pap smears   Past Medical History:  Diagnosis Date  . Allergy   . Chicken pox   . Complication of anesthesia    sedation cause agitation/ only one time/ after D and C  . Mitral valve prolapse     Past Surgical History:  Procedure Laterality Date  . APPENDECTOMY  2003  . BREAST BIOPSY Bilateral 1984   benign  . DILATION AND CURETTAGE OF UTERUS    . [place]   pt unsure of which eye  . TONSILLECTOMY AND ADENOIDECTOMY  1970    Family History  Problem Relation Age of Onset  . Arthritis Father   . Heart disease Father 23       CAD  . Hypertension Father   . Healthy Brother   . Colon cancer Neg Hx   . Pancreatic cancer Neg Hx   . Stomach cancer Neg Hx     Social History   Socioeconomic History  . Marital status: Married    Spouse name: Not on file  . Number of children: Not on file  . Years of education: Not on file  . Highest education level: Not on file  Occupational History  . Not on file  Tobacco Use  . Smoking status: Never Smoker  . Smokeless tobacco: Never Used  Vaping Use  . Vaping Use: Never used    Substance and Sexual Activity  . Alcohol use: Yes    Alcohol/week: 2.0 standard drinks    Types: 2 Glasses of wine per week    Comment: occasional  . Drug use: No  . Sexual activity: Yes    Birth control/protection: Post-menopausal  Other Topics Concern  . Not on file  Social History Narrative  . Not on file   Social Determinants of Health   Financial Resource Strain:   . Difficulty of Paying Living Expenses:   Food Insecurity:   . Worried About Charity fundraiser in the Last Year:   . Arboriculturist in the Last Year:   Transportation Needs:   . Film/video editor (Medical):   Marland Kitchen Lack of Transportation (Non-Medical):   Physical Activity:   . Days of Exercise per Week:   . Minutes of Exercise per Session:   Stress:   . Feeling of Stress :   Social Connections:   . Frequency of Communication with Friends and Family:   . Frequency of Social Gatherings with Friends and Family:   . Attends Religious Services:   . Active Member of Clubs or Organizations:   . Attends Archivist  Meetings:   Marland Kitchen Marital Status:   Intimate Partner Violence:   . Fear of Current or Ex-Partner:   . Emotionally Abused:   Marland Kitchen Physically Abused:   . Sexually Abused:     Outpatient Medications Prior to Visit  Medication Sig Dispense Refill  . APPLE CIDER VINEGAR PO Take by mouth. Drink 1 tsp weekly    . Ascorbic Acid (VITAMIN C) 1000 MG tablet Take by mouth daily.     . B Complex Vitamins (VITAMIN B COMPLEX PO) Take by mouth daily.     Marland Kitchen Bioflavonoid Products (VITAMIN C PLUS PO) Take by mouth.    . Calcium Carb-Cholecalciferol (CALCIUM-VITAMIN D) 500-200 MG-UNIT tablet Take by mouth daily.     . cholecalciferol (VITAMIN D) 1000 units tablet Take 1,000 Units by mouth daily.    . coconut oil OIL Apply 1 application topically as needed.    . Cyanocobalamin (VITAMIN B-12 PO) Take by mouth.    . gabapentin (NEURONTIN) 300 MG capsule Take 1 capsule (300 mg total) by mouth at bedtime. May  increase by one tablet daily up to one by mouth three times daily. This may cause some sedation and can cause some dizziness. 90 capsule 1  . HONEY PO Take by mouth. Take honey with apple cider vinegar weekly    . milk thistle 175 MG tablet Take 175 mg by mouth daily.    Marland Kitchen desoximetasone (TOPICORT) 0.25 % cream Use on affected rash bid prn with no longer than 2 weeks of continuous use. (Patient not taking: Reported on 06/05/2019) 30 g 1  . fluticasone (FLONASE) 50 MCG/ACT nasal spray Place 2 sprays into both nostrils as needed for rhinitis. (Patient not taking: Reported on 06/05/2019) 9.9 g 5   No facility-administered medications prior to visit.    Allergies  Allergen Reactions  . Penicillins Swelling    ROS Review of Systems  Constitutional: Negative for activity change, appetite change, fatigue, fever and unexpected weight change.  HENT: Negative for ear pain, hearing loss, sore throat and trouble swallowing.   Eyes: Negative for visual disturbance.  Respiratory: Negative for cough and shortness of breath.   Cardiovascular: Negative for chest pain and palpitations.  Gastrointestinal: Negative for abdominal pain, blood in stool, constipation and diarrhea.  Endocrine: Negative for polydipsia and polyuria.  Genitourinary: Negative for dysuria and hematuria.  Musculoskeletal: Negative for arthralgias, back pain and myalgias.  Skin: Negative for rash.  Neurological: Negative for dizziness, syncope and headaches.  Hematological: Negative for adenopathy.  Psychiatric/Behavioral: Negative for confusion and dysphoric mood.      Objective:    Physical Exam Constitutional:      Appearance: She is well-developed.  HENT:     Head: Normocephalic and atraumatic.  Eyes:     Pupils: Pupils are equal, round, and reactive to light.  Neck:     Thyroid: No thyromegaly.  Cardiovascular:     Rate and Rhythm: Normal rate and regular rhythm.     Heart sounds: Normal heart sounds. No murmur  heard.   Pulmonary:     Effort: No respiratory distress.     Breath sounds: Normal breath sounds. No wheezing or rales.  Abdominal:     General: Bowel sounds are normal. There is no distension.     Palpations: Abdomen is soft. There is no mass.     Tenderness: There is no abdominal tenderness. There is no guarding or rebound.  Musculoskeletal:        General: Normal range of motion.  Cervical back: Normal range of motion and neck supple.     Right lower leg: No edema.     Left lower leg: No edema.  Lymphadenopathy:     Cervical: No cervical adenopathy.  Skin:    Findings: No rash.  Neurological:     Mental Status: She is alert and oriented to person, place, and time.     Cranial Nerves: No cranial nerve deficit.     Deep Tendon Reflexes: Reflexes normal.  Psychiatric:        Behavior: Behavior normal.        Thought Content: Thought content normal.        Judgment: Judgment normal.     BP (!) 110/60   Pulse 64   Temp 97.6 F (36.4 C) (Oral)   Ht 5\' 2"  (1.575 m)   Wt 128 lb 4 oz (58.2 kg)   SpO2 97%   BMI 23.46 kg/m  Wt Readings from Last 3 Encounters:  04/30/20 128 lb 4 oz (58.2 kg)  06/05/19 125 lb (56.7 kg)  05/09/19 128 lb 12.8 oz (58.4 kg)     Health Maintenance Due  Topic Date Due  . HIV Screening  Never done  . COLONOSCOPY  05/09/2019    There are no preventive care reminders to display for this patient.  Lab Results  Component Value Date   TSH 1.74 08/24/2016   Lab Results  Component Value Date   WBC 5.7 10/23/2017   HGB 12.9 10/23/2017   HCT 39.5 10/23/2017   MCV 92.5 10/23/2017   PLT 263.0 10/23/2017   Lab Results  Component Value Date   NA 138 10/23/2017   K 4.0 10/23/2017   CO2 31 10/23/2017   GLUCOSE 85 10/23/2017   BUN 12 10/23/2017   CREATININE 0.82 10/23/2017   BILITOT 0.6 10/23/2017   ALKPHOS 69 10/23/2017   AST 23 10/23/2017   ALT 14 10/23/2017   PROT 7.4 10/23/2017   ALBUMIN 4.4 10/23/2017   CALCIUM 9.6 10/23/2017    GFR 76.37 10/23/2017   Lab Results  Component Value Date   CHOL 251 (H) 08/24/2016   Lab Results  Component Value Date   HDL 85.00 08/24/2016   Lab Results  Component Value Date   LDLCALC 149 (H) 08/24/2016   Lab Results  Component Value Date   TRIG 84.0 08/24/2016   Lab Results  Component Value Date   CHOLHDL 3 08/24/2016   No results found for: HGBA1C    Assessment & Plan:   Problem List Items Addressed This Visit    None    Visit Diagnoses    Physical exam    -  Primary   Relevant Orders   Basic metabolic panel   Lipid panel   CBC with Differential/Platelet   TSH   Hepatic function panel    Generally healthy 59 year old female.  We discussed the following health maintenance issues  -We suggested Shingrix vaccine.  She will schedule at her convenience.  She had some events going on this weekend and would like to prepare to get this when she has nothing scheduled in case she has any side effects  -Set up referral for repeat colonoscopy  -Set up screening labs  -Continue regular weightbearing exercise  -She will continue follow-up with GYN regarding her Pap smears and mammograms  No orders of the defined types were placed in this encounter.   Follow-up: No follow-ups on file.    Carolann Littler, MD

## 2020-04-30 NOTE — Patient Instructions (Signed)
Preventive Care 59-59 Years Old, Female Preventive care refers to visits with your health care provider and lifestyle choices that can promote health and wellness. This includes:  A yearly physical exam. This may also be called an annual well check.  Regular dental visits and eye exams.  Immunizations.  Screening for certain conditions.  Healthy lifestyle choices, such as eating a healthy diet, getting regular exercise, not using drugs or products that contain nicotine and tobacco, and limiting alcohol use. What can I expect for my preventive care visit? Physical exam Your health care provider will check your:  Height and weight. This may be used to calculate body mass index (BMI), which tells if you are at a healthy weight.  Heart rate and blood pressure.  Skin for abnormal spots. Counseling Your health care provider may ask you questions about your:  Alcohol, tobacco, and drug use.  Emotional well-being.  Home and relationship well-being.  Sexual activity.  Eating habits.  Work and work environment.  Method of birth control.  Menstrual cycle.  Pregnancy history. What immunizations do I need?  Influenza (flu) vaccine  This is recommended every year. Tetanus, diphtheria, and pertussis (Tdap) vaccine  You may need a Td booster every 10 years. Varicella (chickenpox) vaccine  You may need this if you have not been vaccinated. Zoster (shingles) vaccine  You may need this after age 59. Measles, mumps, and rubella (MMR) vaccine  You may need at least one dose of MMR if you were born in 1957 or later. You may also need a second dose. Pneumococcal conjugate (PCV13) vaccine  You may need this if you have certain conditions and were not previously vaccinated. Pneumococcal polysaccharide (PPSV23) vaccine  You may need one or two doses if you smoke cigarettes or if you have certain conditions. Meningococcal conjugate (MenACWY) vaccine  You may need this if you  have certain conditions. Hepatitis A vaccine  You may need this if you have certain conditions or if you travel or work in places where you may be exposed to hepatitis A. Hepatitis B vaccine  You may need this if you have certain conditions or if you travel or work in places where you may be exposed to hepatitis B. Haemophilus influenzae type b (Hib) vaccine  You may need this if you have certain conditions. Human papillomavirus (HPV) vaccine  If recommended by your health care provider, you may need three doses over 6 months. You may receive vaccines as individual doses or as more than one vaccine together in one shot (combination vaccines). Talk with your health care provider about the risks and benefits of combination vaccines. What tests do I need? Blood tests  Lipid and cholesterol levels. These may be checked every 5 years, or more frequently if you are over 59 years old.  Hepatitis C test.  Hepatitis B test. Screening  Lung cancer screening. You may have this screening every year starting at age 59 if you have a 30-pack-year history of smoking and currently smoke or have quit within the past 15 years.  Colorectal cancer screening. All adults should have this screening starting at age 59 and continuing until age 75. Your health care provider may recommend screening at age 45 if you are at increased risk. You will have tests every 1-10 years, depending on your results and the type of screening test.  Diabetes screening. This is done by checking your blood sugar (glucose) after you have not eaten for a while (fasting). You may have this   done every 1-3 years.  Mammogram. This may be done every 1-2 years. Talk with your health care provider about when you should start having regular mammograms. This may depend on whether you have a family history of breast cancer.  BRCA-related cancer screening. This may be done if you have a family history of breast, ovarian, tubal, or peritoneal  cancers.  Pelvic exam and Pap test. This may be done every 3 years starting at age 21. Starting at age 30, this may be done every 5 years if you have a Pap test in combination with an HPV test. Other tests  Sexually transmitted disease (STD) testing.  Bone density scan. This is done to screen for osteoporosis. You may have this scan if you are at high risk for osteoporosis. Follow these instructions at home: Eating and drinking  Eat a diet that includes fresh fruits and vegetables, whole grains, lean protein, and low-fat dairy.  Take vitamin and mineral supplements as recommended by your health care provider.  Do not drink alcohol if: ? Your health care provider tells you not to drink. ? You are pregnant, may be pregnant, or are planning to become pregnant.  If you drink alcohol: ? Limit how much you have to 0-1 drink a day. ? Be aware of how much alcohol is in your drink. In the [place] one drink equals one 12 oz bottle of beer (355 mL), one 5 oz glass of wine (148 mL), or one 1 oz glass of hard liquor (44 mL). Lifestyle  Take daily care of your teeth and gums.  Stay active. Exercise for at least 30 minutes on 5 or more days each week.  Do not use any products that contain nicotine or tobacco, such as cigarettes, e-cigarettes, and chewing tobacco. If you need help quitting, ask your health care provider.  If you are sexually active, practice safe sex. Use a condom or other form of birth control (contraception) in order to prevent pregnancy and STIs (sexually transmitted infections).  If told by your health care provider, take low-dose aspirin daily starting at age 59. What's next?  Visit your health care provider once a year for a well check visit.  Ask your health care provider how often you should have your eyes and teeth checked.  Stay up to date on all vaccines. This information is not intended to replace advice given to you by your health care provider. Make sure you  discuss any questions you have with your health care provider. Document Revised: 06/06/2018 Document Reviewed: 06/06/2018 Elsevier Patient Education  2020 [employer]  Consider Shingrix vaccine at some point this year.    

## 2020-05-01 LAB — CBC WITH DIFFERENTIAL/PLATELET
Absolute Monocytes: 310 cells/uL (ref 200–950)
Basophils Absolute: 50 cells/uL (ref 0–200)
Basophils Relative: 1 %
Eosinophils Absolute: 80 cells/uL (ref 15–500)
Eosinophils Relative: 1.6 %
HCT: 43.4 % (ref 35.0–45.0)
Hemoglobin: 14.1 g/dL (ref 11.7–15.5)
Lymphs Abs: 1930 cells/uL (ref 850–3900)
MCH: 29.3 pg (ref 27.0–33.0)
MCHC: 32.5 g/dL (ref 32.0–36.0)
MCV: 90.2 fL (ref 80.0–100.0)
MPV: 9.9 fL (ref 7.5–12.5)
Monocytes Relative: 6.2 %
Neutro Abs: 2630 cells/uL (ref 1500–7800)
Neutrophils Relative %: 52.6 %
Platelets: 254 10*3/uL (ref 140–400)
RBC: 4.81 10*6/uL (ref 3.80–5.10)
RDW: 12.9 % (ref 11.0–15.0)
Total Lymphocyte: 38.6 %
WBC: 5 10*3/uL (ref 3.8–10.8)

## 2020-05-01 LAB — HEPATIC FUNCTION PANEL
AG Ratio: 1.7 (calc) (ref 1.0–2.5)
ALT: 15 U/L (ref 6–29)
AST: 24 U/L (ref 10–35)
Albumin: 4.7 g/dL (ref 3.6–5.1)
Alkaline phosphatase (APISO): 76 U/L (ref 37–153)
Bilirubin, Direct: 0.1 mg/dL (ref 0.0–0.2)
Globulin: 2.8 g/dL (calc) (ref 1.9–3.7)
Indirect Bilirubin: 0.6 mg/dL (calc) (ref 0.2–1.2)
Total Bilirubin: 0.7 mg/dL (ref 0.2–1.2)
Total Protein: 7.5 g/dL (ref 6.1–8.1)

## 2020-05-01 LAB — LIPID PANEL
Cholesterol: 252 mg/dL — ABNORMAL HIGH (ref <200)
HDL: 91 mg/dL (ref >=50)
LDL Cholesterol (Calc): 141 mg/dL (calc) — ABNORMAL HIGH
Non-HDL Cholesterol (Calc): 161 mg/dL (calc) — ABNORMAL HIGH (ref <130)
Total CHOL/HDL Ratio: 2.8 (calc) (ref <5.0)
Triglycerides: 94 mg/dL (ref <150)

## 2020-05-01 LAB — BASIC METABOLIC PANEL
BUN: 12 mg/dL (ref 7–25)
CO2: 28 mmol/L (ref 20–32)
Calcium: 10 mg/dL (ref 8.6–10.4)
Chloride: 102 mmol/L (ref 98–110)
Creat: 0.92 mg/dL (ref 0.50–1.05)
Glucose, Bld: 87 mg/dL (ref 65–99)
Potassium: 4.2 mmol/L (ref 3.5–5.3)
Sodium: 138 mmol/L (ref 135–146)

## 2020-05-01 LAB — TSH: TSH: 1.98 mIU/L (ref 0.40–4.50)

## 2020-08-11 ENCOUNTER — Telehealth: Payer: Self-pay

## 2020-08-11 NOTE — Telephone Encounter (Signed)
Called and spoke w/ patient she said that she having cough, sore throat , dizziness, and feeling tried. Advised pt have a covid test and  follow up US on 08/13/2020 for Virtual Visit. Informed pt is symptoms get worse or if she start to have Shortness of Breath advised  to go  ER or Urgent Care.

## 2020-08-11 NOTE — Telephone Encounter (Signed)
Patient called in stating she is having a allergic reaction and has also developed a sore throat and pressure on her lungs. Explained to patient that she would have to have a virtual visit due to the sore throat. Let her know I would send message to provider to see if he is willing to see her in person but it was his discretion if he would see her in person or not    Please advise

## 2020-08-13 ENCOUNTER — Encounter: Payer: Self-pay | Admitting: Family Medicine

## 2020-08-13 ENCOUNTER — Telehealth: Payer: BC Managed Care – PPO | Admitting: Family Medicine

## 2020-08-15 NOTE — Progress Notes (Signed)
Pt had sudden death in family and unable to engage in visit today.

## 2020-08-18 ENCOUNTER — Ambulatory Visit (INDEPENDENT_AMBULATORY_CARE_PROVIDER_SITE_OTHER): Payer: BC Managed Care – PPO | Admitting: Family Medicine

## 2020-08-18 ENCOUNTER — Other Ambulatory Visit: Payer: Self-pay

## 2020-08-18 ENCOUNTER — Encounter: Payer: Self-pay | Admitting: Family Medicine

## 2020-08-18 VITALS — BP 130/80 | HR 75 | Ht 62.0 in | Wt 126.5 lb

## 2020-08-18 DIAGNOSIS — R030 Elevated blood-pressure reading, without diagnosis of hypertension: Secondary | ICD-10-CM

## 2020-08-18 NOTE — Progress Notes (Signed)
Established Patient Office Visit  Subjective:  Patient ID: Rebekah Pratt, female    DOB: [birth year]  Age: 59 y.o. MRN: [id number]  CC:  Chief Complaint  Patient presents with   Follow-up     having high blood reading , mother pass away     Rebekah Pratt presents for concern for elevated blood pressure readings.  She was actually scheduled for a visit on the fifth for virtual encounter for some sinus symptoms and right before that call she received a phone call that her mom just passed away at age of 13.  Her sinus symptoms actually cleared at this time.  Her concern is that she went Monday to donate blood at her church and had elevated blood pressure readings.  She thinks that these were around 409 diastolic and she is not sure about systolic.  She had some mild headaches.  She does not have a home blood pressure cuff.  She has never had hypertension previously.  No regular alcohol use.  No excessive sodium use.  No regular nonsteroidal use.  Possible history of hypertension in her father.  Currently not exercising regularly.  No recent peripheral edema.  Past Medical History:  Diagnosis Date   Allergy    Chicken pox    Complication of anesthesia    sedation cause agitation/ only one time/ after D and C   Mitral valve prolapse     Past Surgical History:  Procedure Laterality Date   APPENDECTOMY  2003   BREAST BIOPSY Bilateral 1984   benign   DILATION AND CURETTAGE OF UTERUS     EYE MUSCLE SURGERY  1967   pt unsure of which eye   TONSILLECTOMY AND ADENOIDECTOMY  1970    Family History  Problem Relation Age of Onset   Arthritis Father    Heart disease Father 58       CAD   Hypertension Father    Healthy Brother    Colon cancer Neg Hx    Pancreatic cancer Neg Hx    Stomach cancer Neg Hx     Social History   Socioeconomic History   Marital status: Married    Spouse name: Not on file   Number of children: Not on file   Years of education:  Not on file   Highest education level: Not on file  Occupational History   Not on file  Tobacco Use   Smoking status: Never Smoker   Smokeless tobacco: Never Used  Vaping Use   Vaping Use: Never used  Substance and Sexual Activity   Alcohol use: Yes    Alcohol/week: 2.0 standard drinks    Types: 2 Glasses of wine per week    Comment: occasional   Drug use: No   Sexual activity: Yes    Birth control/protection: Post-menopausal  Other Topics Concern   Not on file  Social History Narrative   Not on file   Social Determinants of Health   Financial Resource Strain:    Difficulty of Paying Living Expenses: Not on file  Food Insecurity:    Worried About Charity fundraiser in the Last Year: Not on file   [employer] of Food in the Last Year: Not on file  Transportation Needs:    Lack of Transportation (Medical): Not on file   Lack of Transportation (Non-Medical): Not on file  Physical Activity:    Days of Exercise per Week: Not on file   Minutes of Exercise per Session:  Not on file  Stress:    Feeling of Stress : Not on file  Social Connections:    Frequency of Communication with Friends and Family: Not on file   Frequency of Social Gatherings with Friends and Family: Not on file   Attends Religious Services: Not on file   Active Member of Clubs or Organizations: Not on file   Attends Archivist Meetings: Not on file   Marital Status: Not on file  Intimate Partner Violence:    Fear of Current or Ex-Partner: Not on file   Emotionally Abused: Not on file   Physically Abused: Not on file   Sexually Abused: Not on file    Outpatient Medications Prior to Visit  Medication Sig Dispense Refill   APPLE CIDER VINEGAR PO Take by mouth. Drink 1 tsp weekly     Ascorbic Acid (VITAMIN C) 1000 MG tablet Take by mouth daily.      B Complex Vitamins (VITAMIN B COMPLEX PO) Take by mouth daily.      HONEY PO Take by mouth. Take honey with apple  cider vinegar weekly     milk thistle 175 MG tablet Take 175 mg by mouth daily.     Calcium Carb-Cholecalciferol (CALCIUM-VITAMIN D) 500-200 MG-UNIT tablet Take by mouth daily.      Bioflavonoid Products (VITAMIN C PLUS PO) Take by mouth.     cholecalciferol (VITAMIN D) 1000 units tablet Take 1,000 Units by mouth daily.     coconut oil OIL Apply 1 application topically as needed.     Cyanocobalamin (VITAMIN B-12 PO) Take by mouth.     gabapentin (NEURONTIN) 300 MG capsule Take 1 capsule (300 mg total) by mouth at bedtime. May increase by one tablet daily up to one by mouth three times daily. This may cause some sedation and can cause some dizziness. (Patient not taking: Reported on 08/13/2020) 90 capsule 1   No facility-administered medications prior to visit.    Allergies  Allergen Reactions   Penicillins Swelling    ROS Review of Systems  Constitutional: Negative for fatigue.  Eyes: Negative for visual disturbance.  Respiratory: Negative for cough, chest tightness, shortness of breath and wheezing.   Cardiovascular: Negative for chest pain, palpitations and leg swelling.  Neurological: Negative for dizziness, seizures, syncope, weakness and light-headedness.      Objective:    Physical Exam Vitals reviewed.  Constitutional:      Appearance: Normal appearance.  Cardiovascular:     Rate and Rhythm: Normal rate and regular rhythm.  Pulmonary:     Effort: Pulmonary effort is normal.     Breath sounds: Normal breath sounds.  Musculoskeletal:     Right lower leg: No edema.     Left lower leg: No edema.  Neurological:     Mental Status: She is alert.     BP 130/80 (BP Location: Left Arm, Patient Position: Sitting, Cuff Size: Normal)    Pulse 75    Ht 5\' 2"  (1.575 m)    Wt 126 lb 8 oz (57.4 kg)    SpO2 98%    BMI 23.14 kg/m  Wt Readings from Last 3 Encounters:  08/18/20 126 lb 8 oz (57.4 kg)  08/13/20 120 lb (54.4 kg)  04/30/20 128 lb 4 oz (58.2 kg)     Health  Maintenance Due  Topic Date Due   HIV Screening  Never done   COLONOSCOPY  05/09/2019   INFLUENZA VACCINE  Never done   MAMMOGRAM  08/08/2020  There are no preventive care reminders to display for this patient.  Lab Results  Component Value Date   TSH 1.98 04/30/2020   Lab Results  Component Value Date   WBC 5.0 04/30/2020   HGB 14.1 04/30/2020   HCT 43.4 04/30/2020   MCV 90.2 04/30/2020   PLT 254 04/30/2020   Lab Results  Component Value Date   NA 138 04/30/2020   K 4.2 04/30/2020   CO2 28 04/30/2020   GLUCOSE 87 04/30/2020   BUN 12 04/30/2020   CREATININE 0.92 04/30/2020   BILITOT 0.7 04/30/2020   ALKPHOS 69 10/23/2017   AST 24 04/30/2020   ALT 15 04/30/2020   PROT 7.5 04/30/2020   ALBUMIN 4.4 10/23/2017   CALCIUM 10.0 04/30/2020   GFR 76.37 10/23/2017   Lab Results  Component Value Date   CHOL 252 (H) 04/30/2020   Lab Results  Component Value Date   HDL 91 04/30/2020   Lab Results  Component Value Date   LDLCALC 141 (H) 04/30/2020   Lab Results  Component Value Date   TRIG 94 04/30/2020   Lab Results  Component Value Date   CHOLHDL 2.8 04/30/2020   No results found for: HGBA1C    Assessment & Plan:   Problem List Items Addressed This Visit    None    Visit Diagnoses    Elevated blood pressure reading    -  Primary    Repeat blood pressure today both left arm and right arm seated 132/70  Reassurance and we recommend observation for now.  Discussed nonpharmacologic management with weight control, regular aerobic exercise, Dash diet, moderation in alcohol.  She will try to get monitored intermittently and be in touch of consistent readings greater than 140/90  No orders of the defined types were placed in this encounter.   Follow-up: No follow-ups on file.    Carolann Littler, MD

## 2020-08-18 NOTE — Patient Instructions (Signed)
DASH Eating Plan DASH stands for "Dietary Approaches to Stop Hypertension." The DASH eating plan is a healthy eating plan that has been shown to reduce high blood pressure (hypertension). It may also reduce your risk for type 2 diabetes, heart disease, and stroke. The DASH eating plan may also help with weight loss. What are tips for following this plan?  General guidelines  Avoid eating more than 2,300 mg (milligrams) of salt (sodium) a day. If you have hypertension, you may need to reduce your sodium intake to 1,500 mg a day.  Limit alcohol intake to no more than 1 drink a day for nonpregnant women and 2 drinks a day for men. One drink equals 12 oz of beer, 5 oz of wine, or 1 oz of hard liquor.  Work with your health care provider to maintain a healthy body weight or to lose weight. Ask what an ideal weight is for you.  Get at least 30 minutes of exercise that causes your heart to beat faster (aerobic exercise) most days of the week. Activities may include walking, swimming, or biking.  Work with your health care provider or diet and nutrition specialist (dietitian) to adjust your eating plan to your individual calorie needs. Reading food labels   Check food labels for the amount of sodium per serving. Choose foods with less than 5 percent of the Daily Value of sodium. Generally, foods with less than 300 mg of sodium per serving fit into this eating plan.  To find whole grains, look for the word "whole" as the first word in the ingredient list. Shopping  Buy products labeled as "low-sodium" or "no salt added."  Buy fresh foods. Avoid canned foods and premade or frozen meals. Cooking  Avoid adding salt when cooking. Use salt-free seasonings or herbs instead of table salt or sea salt. Check with your health care provider or pharmacist before using salt substitutes.  Do not fry foods. Cook foods using healthy methods such as baking, boiling, grilling, and broiling instead.  Cook with  heart-healthy oils, such as olive, canola, soybean, or sunflower oil. Meal planning  Eat a balanced diet that includes: ? 5 or more servings of fruits and vegetables each day. At each meal, try to fill half of your plate with fruits and vegetables. ? Up to 6-8 servings of whole grains each day. ? Less than 6 oz of lean meat, poultry, or fish each day. A 3-oz serving of meat is about the same size as a deck of cards. One egg equals 1 oz. ? 2 servings of low-fat dairy each day. ? A serving of nuts, seeds, or beans 5 times each week. ? Heart-healthy fats. Healthy fats called Omega-3 fatty acids are found in foods such as flaxseeds and coldwater fish, like sardines, salmon, and mackerel.  Limit how much you eat of the following: ? Canned or prepackaged foods. ? Food that is high in trans fat, such as fried foods. ? Food that is high in saturated fat, such as fatty meat. ? Sweets, desserts, sugary drinks, and other foods with added sugar. ? Full-fat dairy products.  Do not salt foods before eating.  Try to eat at least 2 vegetarian meals each week.  Eat more home-cooked food and less restaurant, buffet, and fast food.  When eating at a restaurant, ask that your food be prepared with less salt or no salt, if possible. What foods are recommended? The items listed may not be a complete list. Talk with your dietitian about   what dietary choices are best for you. Grains Whole-grain or whole-wheat bread. Whole-grain or whole-wheat pasta. Brown rice. Oatmeal. Quinoa. Bulgur. Whole-grain and low-sodium cereals. Pita bread. Low-fat, low-sodium crackers. Whole-wheat flour tortillas. Vegetables Fresh or frozen vegetables (raw, steamed, roasted, or grilled). Low-sodium or reduced-sodium tomato and vegetable juice. Low-sodium or reduced-sodium tomato sauce and tomato paste. Low-sodium or reduced-sodium canned vegetables. Fruits All fresh, dried, or frozen fruit. Canned fruit in natural juice (without  added sugar). Meat and other protein foods Skinless chicken or turkey. Ground chicken or turkey. Pork with fat trimmed off. Fish and seafood. Egg whites. Dried beans, peas, or lentils. Unsalted nuts, nut butters, and seeds. Unsalted canned beans. Lean cuts of beef with fat trimmed off. Low-sodium, lean deli meat. Dairy Low-fat (1%) or fat-free (skim) milk. Fat-free, low-fat, or reduced-fat cheeses. Nonfat, low-sodium ricotta or cottage cheese. Low-fat or nonfat yogurt. Low-fat, low-sodium cheese. Fats and oils Soft margarine without trans fats. Vegetable oil. Low-fat, reduced-fat, or light mayonnaise and salad dressings (reduced-sodium). Canola, safflower, olive, soybean, and sunflower oils. Avocado. Seasoning and other foods Herbs. Spices. Seasoning mixes without salt. Unsalted popcorn and pretzels. Fat-free sweets. What foods are not recommended? The items listed may not be a complete list. Talk with your dietitian about what dietary choices are best for you. Grains Baked goods made with fat, such as croissants, muffins, or some breads. Dry pasta or rice meal packs. Vegetables Creamed or fried vegetables. Vegetables in a cheese sauce. Regular canned vegetables (not low-sodium or reduced-sodium). Regular canned tomato sauce and paste (not low-sodium or reduced-sodium). Regular tomato and vegetable juice (not low-sodium or reduced-sodium). Pickles. Olives. Fruits Canned fruit in a light or heavy syrup. Fried fruit. Fruit in cream or butter sauce. Meat and other protein foods Fatty cuts of meat. Ribs. Fried meat. Bacon. Sausage. Bologna and other processed lunch meats. Salami. Fatback. Hotdogs. Bratwurst. Salted nuts and seeds. Canned beans with added salt. Canned or smoked fish. Whole eggs or egg yolks. Chicken or turkey with skin. Dairy Whole or 2% milk, cream, and half-and-half. Whole or full-fat cream cheese. Whole-fat or sweetened yogurt. Full-fat cheese. Nondairy creamers. Whipped toppings.  Processed cheese and cheese spreads. Fats and oils Butter. Stick margarine. Lard. Shortening. Ghee. Bacon fat. Tropical oils, such as coconut, palm kernel, or palm oil. Seasoning and other foods Salted popcorn and pretzels. Onion salt, garlic salt, seasoned salt, table salt, and sea salt. Worcestershire sauce. Tartar sauce. Barbecue sauce. Teriyaki sauce. Soy sauce, including reduced-sodium. Steak sauce. Canned and packaged gravies. Fish sauce. Oyster sauce. Cocktail sauce. Horseradish that you find on the shelf. Ketchup. Mustard. Meat flavorings and tenderizers. Bouillon cubes. Hot sauce and Tabasco sauce. Premade or packaged marinades. Premade or packaged taco seasonings. Relishes. Regular salad dressings. Where to find more information:  National Heart, Lung, and Blood Institute: www.nhlbi.nih.gov  American Heart Association: www.heart.org Summary  The DASH eating plan is a healthy eating plan that has been shown to reduce high blood pressure (hypertension). It may also reduce your risk for type 2 diabetes, heart disease, and stroke.  With the DASH eating plan, you should limit salt (sodium) intake to 2,300 mg a day. If you have hypertension, you may need to reduce your sodium intake to 1,500 mg a day.  When on the DASH eating plan, aim to eat more fresh fruits and vegetables, whole grains, lean proteins, low-fat dairy, and heart-healthy fats.  Work with your health care provider or diet and nutrition specialist (dietitian) to adjust your eating plan to your   individual calorie needs. This information is not intended to replace advice given to you by your health care provider. Make sure you discuss any questions you have with your health care provider. Document Revised: 09/07/2017 Document Reviewed: 09/18/2016 Elsevier Patient Education  [place].  Exercise regularly  Monitor blood pressure and be in touch if consistently > 140/90.

## 2020-09-09 ENCOUNTER — Encounter: Payer: Self-pay | Admitting: Family Medicine

## 2021-11-08 ENCOUNTER — Ambulatory Visit (INDEPENDENT_AMBULATORY_CARE_PROVIDER_SITE_OTHER): Payer: BC Managed Care – PPO | Admitting: Family Medicine

## 2021-11-08 ENCOUNTER — Encounter: Payer: Self-pay | Admitting: Family Medicine

## 2021-11-08 VITALS — BP 120/62 | HR 70 | Temp 97.7°F | Ht 62.0 in | Wt 129.6 lb

## 2021-11-08 DIAGNOSIS — Z23 Encounter for immunization: Secondary | ICD-10-CM | POA: Diagnosis not present

## 2021-11-08 DIAGNOSIS — Z0000 Encounter for general adult medical examination without abnormal findings: Secondary | ICD-10-CM

## 2021-11-08 LAB — CBC WITH DIFFERENTIAL/PLATELET
Basophils Absolute: 0 10*3/uL (ref 0.0–0.1)
Basophils Relative: 0.6 % (ref 0.0–3.0)
Eosinophils Absolute: 0.1 10*3/uL (ref 0.0–0.7)
Eosinophils Relative: 2.5 % (ref 0.0–5.0)
HCT: 39.9 % (ref 36.0–46.0)
Hemoglobin: 13 g/dL (ref 12.0–15.0)
Lymphocytes Relative: 30.9 % (ref 12.0–46.0)
Lymphs Abs: 1.6 10*3/uL (ref 0.7–4.0)
MCHC: 32.7 g/dL (ref 30.0–36.0)
MCV: 91.2 fl (ref 78.0–100.0)
Monocytes Absolute: 0.4 10*3/uL (ref 0.1–1.0)
Monocytes Relative: 7 % (ref 3.0–12.0)
Neutro Abs: 3 10*3/uL (ref 1.4–7.7)
Neutrophils Relative %: 59 % (ref 43.0–77.0)
Platelets: 248 10*3/uL (ref 150.0–400.0)
RBC: 4.37 Mil/uL (ref 3.87–5.11)
RDW: 13.7 % (ref 11.5–15.5)
WBC: 5.2 10*3/uL (ref 4.0–10.5)

## 2021-11-08 LAB — LIPID PANEL
Cholesterol: 280 mg/dL — ABNORMAL HIGH (ref 0–200)
HDL: 96.5 mg/dL (ref >39.00)
LDL Cholesterol: 168 mg/dL — ABNORMAL HIGH (ref 0–99)
NonHDL: 183.43
Total CHOL/HDL Ratio: 3
Triglycerides: 79 mg/dL (ref 0.0–149.0)
VLDL: 15.8 mg/dL (ref 0.0–40.0)

## 2021-11-08 LAB — BASIC METABOLIC PANEL
BUN: 15 mg/dL (ref 6–23)
CO2: 30 mEq/L (ref 19–32)
Calcium: 9.9 mg/dL (ref 8.4–10.5)
Chloride: 102 mEq/L (ref 96–112)
Creatinine, Ser: 0.9 mg/dL (ref 0.40–1.20)
GFR: 69.23 mL/min (ref >60.00)
Glucose, Bld: 93 mg/dL (ref 70–99)
Potassium: 3.9 mEq/L (ref 3.5–5.1)
Sodium: 138 mEq/L (ref 135–145)

## 2021-11-08 LAB — HEPATIC FUNCTION PANEL
ALT: 15 U/L (ref 0–35)
AST: 26 U/L (ref 0–37)
Albumin: 4.7 g/dL (ref 3.5–5.2)
Alkaline Phosphatase: 71 U/L (ref 39–117)
Bilirubin, Direct: 0.1 mg/dL (ref 0.0–0.3)
Total Bilirubin: 0.7 mg/dL (ref 0.2–1.2)
Total Protein: 8 g/dL (ref 6.0–8.3)

## 2021-11-08 LAB — TSH: TSH: 1.84 u[IU]/mL (ref 0.35–5.50)

## 2021-11-08 NOTE — Patient Instructions (Signed)
Consider mammogram and consider Shingrix vaccine at some point this year.

## 2021-11-08 NOTE — Addendum Note (Signed)
Addended by: Rebecca Eaton on: 11/08/2021 09:48 AM   Modules accepted: Orders

## 2021-11-08 NOTE — Progress Notes (Addendum)
Established Patient Office Visit  Subjective:  Patient ID: Rebekah Pratt, female    DOB: January 24, 1961  Age: 61 y.o. MRN: [id number]  CC:  Chief Complaint  Patient presents with   Annual Exam    HPI MARRI MCNEFF presents for physical exam.  Generally doing well.  She sees GYN but has not seen them recently.  She gets her mammograms and Pap smears through her GYN.  She will be going on a mission trip with her church to [place] in April.  She is also expecting her first grandchild in May.  Minahil has not had documented Tdap in the past 10 years.  She is not sure if she has ever had Tdap.  Health maintenance reviewed  -No history of Shingrix.  She has had clinical breakout with shingles previously -Needs tetanus -Due for repeat mammogram.  She plans to set this up through her GYN -Colonoscopy due 01/10/25  Family history-her mom died in her 46s at home unknown cause.  Father died of complications of CAD around age 22.  She has a brother who is alive and well.  No known family history of diabetes.  Social history-she is married.  Non-smoker.  Usually 2-4 alcoholic beverages per week.  She has a son and daughter.  Her daughter lives out in [place].  Son will be expecting their first child in May  The 10-year ASCVD risk score (Arnett DK, et al., 2018-01-10) is: 2.9%   Values used to calculate the score:     Age: 62 years     Sex: Female     Is Non-Hispanic African American: No     Diabetic: No     Tobacco smoker: No     Systolic Blood Pressure: 782 mmHg     Is BP treated: No     HDL Cholesterol: 96.5 mg/dL     Total Cholesterol: 280 mg/dL   Past Medical History:  Diagnosis Date   Allergy    Chicken pox    Complication of anesthesia    sedation cause agitation/ only one time/ after D and C   Mitral valve prolapse     Past Surgical History:  Procedure Laterality Date   APPENDECTOMY  2003   BREAST BIOPSY Bilateral 1983-01-11   benign   DILATION AND CURETTAGE OF UTERUS     EYE MUSCLE  SURGERY  1967   pt unsure of which eye   TONSILLECTOMY AND ADENOIDECTOMY  1970    Family History  Problem Relation Age of Onset   Arthritis Father    Heart disease Father 33       CAD   Hypertension Father    Healthy Brother    Colon cancer Neg Hx    Pancreatic cancer Neg Hx    Stomach cancer Neg Hx     Social History   Socioeconomic History   Marital status: Married    Spouse name: Not on file   Number of children: Not on file   Years of education: Not on file   Highest education level: Not on file  Occupational History   Not on file  Tobacco Use   Smoking status: Never   Smokeless tobacco: Never  Vaping Use   Vaping Use: Never used  Substance and Sexual Activity   Alcohol use: Yes    Alcohol/week: 2.0 standard drinks    Types: 2 Glasses of wine per week    Comment: occasional   Drug use: No   Sexual activity: Yes  Birth control/protection: Post-menopausal  Other Topics Concern   Not on file  Social History Narrative   Not on file   Social Determinants of Health   Financial Resource Strain: Not on file  Food Insecurity: Not on file  Transportation Needs: Not on file  Physical Activity: Not on file  Stress: Not on file  Social Connections: Not on file  Intimate Partner Violence: Not on file    Outpatient Medications Prior to Visit  Medication Sig Dispense Refill   APPLE CIDER VINEGAR PO Take by mouth. Drink 1 tsp weekly     Ascorbic Acid (VITAMIN C) 1000 MG tablet Take by mouth daily.      B Complex Vitamins (VITAMIN B COMPLEX PO) Take by mouth daily.      HONEY PO Take by mouth. Take honey with apple cider vinegar weekly     milk thistle 175 MG tablet Take 175 mg by mouth daily.     No facility-administered medications prior to visit.    Allergies  Allergen Reactions   Penicillins Swelling    ROS Review of Systems  Constitutional:  Negative for activity change, appetite change, fatigue, fever and unexpected weight change.  HENT:  Negative  for ear pain, hearing loss, sore throat and trouble swallowing.   Eyes:  Negative for visual disturbance.  Respiratory:  Negative for cough and shortness of breath.   Cardiovascular:  Negative for chest pain and palpitations.  Gastrointestinal:  Negative for abdominal pain, blood in stool, constipation and diarrhea.  Endocrine: Negative for polydipsia and polyuria.  Genitourinary:  Negative for dysuria and hematuria.  Musculoskeletal:  Negative for arthralgias, back pain and myalgias.  Skin:  Negative for rash.  Neurological:  Negative for dizziness, syncope and headaches.  Hematological:  Negative for adenopathy.  Psychiatric/Behavioral:  Negative for confusion and dysphoric mood.      Objective:    Physical Exam Constitutional:      Appearance: She is well-developed.  HENT:     Head: Normocephalic and atraumatic.  Eyes:     Pupils: Pupils are equal, round, and reactive to light.  Neck:     Thyroid: No thyromegaly.  Cardiovascular:     Rate and Rhythm: Normal rate and regular rhythm.     Heart sounds: Normal heart sounds. No murmur heard. Pulmonary:     Effort: No respiratory distress.     Breath sounds: Normal breath sounds. No wheezing or rales.  Abdominal:     General: Bowel sounds are normal. There is no distension.     Palpations: Abdomen is soft. There is no mass.     Tenderness: There is no abdominal tenderness. There is no guarding or rebound.  Genitourinary:    Comments: Per GYN Musculoskeletal:        General: Normal range of motion.     Cervical back: Normal range of motion and neck supple.  Lymphadenopathy:     Cervical: No cervical adenopathy.  Skin:    Findings: No rash.  Neurological:     Mental Status: She is alert and oriented to person, place, and time.     Cranial Nerves: No cranial nerve deficit.  Psychiatric:        Behavior: Behavior normal.        Thought Content: Thought content normal.        Judgment: Judgment normal.    BP 120/62 (BP  Location: Left Arm, Patient Position: Sitting, Cuff Size: Normal)    Pulse 70    Temp 97.7  F (36.5 C) (Oral)    Ht 5\' 2"  (1.575 m)    Wt 129 lb 9.6 oz (58.8 kg)    SpO2 98%    BMI 23.70 kg/m  Wt Readings from Last 3 Encounters:  11/08/21 129 lb 9.6 oz (58.8 kg)  08/18/20 126 lb 8 oz (57.4 kg)  08/13/20 120 lb (54.4 kg)     Health Maintenance Due  Topic Date Due   HIV Screening  Never done   TETANUS/TDAP  Never done   Zoster Vaccines- Shingrix (1 of 2) Never done   MAMMOGRAM  08/08/2020   PAP SMEAR-Modifier  10/09/2021    There are no preventive care reminders to display for this patient.  Lab Results  Component Value Date   TSH 1.98 04/30/2020   Lab Results  Component Value Date   WBC 5.0 04/30/2020   HGB 14.1 04/30/2020   HCT 43.4 04/30/2020   MCV 90.2 04/30/2020   PLT 254 04/30/2020   Lab Results  Component Value Date   NA 138 04/30/2020   K 4.2 04/30/2020   CO2 28 04/30/2020   GLUCOSE 87 04/30/2020   BUN 12 04/30/2020   CREATININE 0.92 04/30/2020   BILITOT 0.7 04/30/2020   ALKPHOS 69 10/23/2017   AST 24 04/30/2020   ALT 15 04/30/2020   PROT 7.5 04/30/2020   ALBUMIN 4.4 10/23/2017   CALCIUM 10.0 04/30/2020   GFR 76.37 10/23/2017   Lab Results  Component Value Date   CHOL 252 (H) 04/30/2020   Lab Results  Component Value Date   HDL 91 04/30/2020   Lab Results  Component Value Date   LDLCALC 141 (H) 04/30/2020   Lab Results  Component Value Date   TRIG 94 04/30/2020   Lab Results  Component Value Date   CHOLHDL 2.8 04/30/2020   No results found for: HGBA1C    Assessment & Plan:   Problem List Items Addressed This Visit   None Visit Diagnoses     Physical exam    -  Primary   Relevant Orders   Basic metabolic panel   Lipid panel   CBC with Differential/Platelet   TSH   Hepatic function panel     Generally healthy 61 year old female.  We discussed several health maintenance issues as follows:  -Recommend Tdap especially in view  of upcoming grandchild anticipated in May and patient consents  -She is encouraged to set up repeat mammogram she plans to do so through GYN  -Consider Shingrix at some point this year  -Obtain screening labs as above  -Continue regular weightbearing exercise.  No orders of the defined types were placed in this encounter.   Follow-up: No follow-ups on file.    Carolann Littler, MD

## 2022-05-04 LAB — HM DEXA SCAN

## 2022-05-05 ENCOUNTER — Encounter: Payer: Self-pay | Admitting: Family Medicine

## 2022-05-19 ENCOUNTER — Encounter: Payer: Self-pay | Admitting: Family Medicine

## 2022-05-19 ENCOUNTER — Ambulatory Visit (INDEPENDENT_AMBULATORY_CARE_PROVIDER_SITE_OTHER): Payer: BC Managed Care – PPO | Admitting: Family Medicine

## 2022-05-19 VITALS — BP 112/70 | HR 61 | Temp 97.9°F | Ht 62.0 in | Wt 126.1 lb

## 2022-05-19 DIAGNOSIS — L853 Xerosis cutis: Secondary | ICD-10-CM | POA: Diagnosis not present

## 2022-05-19 DIAGNOSIS — M810 Age-related osteoporosis without current pathological fracture: Secondary | ICD-10-CM

## 2022-05-19 MED ORDER — TRIAMCINOLONE ACETONIDE 0.1 % EX CREA: 1.0000 | TOPICAL_CREAM | Freq: Two times a day (BID) | CUTANEOUS | 0 refills | Status: AC

## 2022-05-19 NOTE — Progress Notes (Signed)
Established Patient Office Visit  Subjective   Patient ID: Rebekah Pratt, female    DOB: 1961/04/27  Age: 61 y.o. MRN: [id number]  Chief Complaint  Patient presents with   Results   Mold exposure    Patient complains of mold exposure, x4 months,     HPI   Mry is seen for the following issues  She had recent DEXA scan with T-score -2.5.  Currently not on any specific therapy.  She stays very active with exercise.  Knows importance of daily calcium and vitamin D.  No history of fractures.  Non-smoker.  No excessive alcohol use.  She sees GYN  Very dry skin right hand dorsally.  She has been spending time this year cleaning out her parents house.  They are both deceased now.  Some pruritus.  She did have concerns whether may be a mold issue in that house.  There is clearly mildew.  She was out [place] recently and was hiking in [place] and felt like her lungs were "tight ".  No chronic cough.  No wheezing.  Tolerating exercise here without difficulty.  Past Medical History:  Diagnosis Date   Allergy    Chicken pox    Complication of anesthesia    sedation cause agitation/ only one time/ after D and C   Mitral valve prolapse    Past Surgical History:  Procedure Laterality Date   APPENDECTOMY  2003   BREAST BIOPSY Bilateral 1984   benign   DILATION AND CURETTAGE OF UTERUS     EYE MUSCLE SURGERY  1967   pt unsure of which eye   [place]    reports that she has never smoked. She has never used smokeless tobacco. She reports current alcohol use of about 2.0 standard drinks of alcohol per week. She reports that she does not use drugs. family history includes Arthritis in her father; Healthy in her brother; Heart disease (age of onset: 34) in her father; Hypertension in her father. Allergies  Allergen Reactions   Penicillins Swelling    Review of Systems  Constitutional:  Negative for chills and fever.  Respiratory:  Negative for cough,  hemoptysis, sputum production and wheezing.   Cardiovascular:  Negative for chest pain and leg swelling.  Skin:  Positive for rash.      Objective:     BP 112/70 (BP Location: Left Arm, Patient Position: Sitting, Cuff Size: Normal)   Pulse 61   Temp 97.9 F (36.6 C) (Oral)   Ht '5\' 2"'$  (1.575 m)   Wt 126 lb 1.6 oz (57.2 kg)   SpO2 98%   BMI 23.06 kg/m    Physical Exam Constitutional:      Appearance: She is well-developed.  Eyes:     Pupils: Pupils are equal, round, and reactive to light.  Neck:     Thyroid: No thyromegaly.     Vascular: No JVD.  Cardiovascular:     Rate and Rhythm: Normal rate and regular rhythm.     Heart sounds:     No gallop.  Pulmonary:     Effort: Pulmonary effort is normal. No respiratory distress.     Breath sounds: Normal breath sounds. No wheezing or rales.  Musculoskeletal:     Cervical back: Neck supple.  Skin:    Comments: He has extreme dryness especially right hand dorsally and laterally.  Neurological:     Mental Status: She is alert.      No results found  for any visits on 05/19/22.    The 10-year ASCVD risk score (Arnett DK, et al., 2019) is: 2.5%    Assessment & Plan:   Problem List Items Addressed This Visit   None Visit Diagnoses     Osteoporosis without current pathological fracture, unspecified osteoporosis type    -  Primary   Relevant Medications   Cholecalciferol (VITAMIN D3) 250 MCG (10000 UT) TABS   Dry skin dermatitis         -Try to keep hand out of water is much as possible -Continue liberal use of moisturizers -Short-term trial of triamcinolone 0.1% cream to use twice daily as needed  -Discussed osteoporosis treatment in some detail.  Given her recent DEXA scan consider bisphosphonate such as Fosamax -Continue regular weightbearing exercise -Continue daily vitamin D and calcium -She should consider follow-up DEXA in 1 to 2 years -She will see her GYN soon and will discuss further with them at that  point possibly -She will let us know if we can assist with prescribing medication such as Fosamax.  No follow-ups on file.    Carolann Littler, MD

## 2022-06-01 ENCOUNTER — Telehealth: Payer: Self-pay | Admitting: Family Medicine

## 2022-06-01 NOTE — Telephone Encounter (Signed)
Pt called to say her insurance told her that MD is no longer a [employer] Customer service manager) for BC/BS.  Pt states this will affect her copay, etc...  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  Pt is wondering if MD will resume as a CPPP?  Pt added that MD was a CPPP in October, and wanted to know why the change?  Please advise. [phone]

## 2022-06-02 NOTE — Telephone Encounter (Signed)
See below message

## 2023-02-27 NOTE — Progress Notes (Unsigned)
ACUTE VISIT No chief complaint on file.  HPI: Ms.Rebekah Pratt is a 62 y.o. female, who is here today complaining of *** HPI  Review of Systems See other pertinent positives and negatives in HPI.  Current Outpatient Medications on File Prior to Visit  Medication Sig Dispense Refill   APPLE CIDER VINEGAR PO Take by mouth. Drink 1 tsp weekly     Ascorbic Acid (VITAMIN C) 1000 MG tablet Take by mouth daily.      B Complex Vitamins (VITAMIN B COMPLEX PO) Take by mouth daily.      Cholecalciferol (VITAMIN D3) 250 MCG (10000 UT) TABS      HONEY PO Take by mouth. Take honey with apple cider vinegar weekly     Magnesium 100 MG TABS      triamcinolone cream (KENALOG) 0.1 % Apply 1 Application topically 2 (two) times daily. 30 g 0   No current facility-administered medications on file prior to visit.    Past Medical History:  Diagnosis Date   Allergy    Chicken pox    Complication of anesthesia    sedation cause agitation/ only one time/ after D and C   Mitral valve prolapse    Allergies  Allergen Reactions   Penicillins Swelling    Social History   Socioeconomic History   Marital status: Married    Spouse name: Not on file   Number of children: Not on file   Years of education: Not on file   Highest education level: Bachelor's degree (e.g., BA, AB, BS)  Occupational History   Not on file  Tobacco Use   Smoking status: Never   Smokeless tobacco: Never  Vaping Use   Vaping Use: Never used  Substance and Sexual Activity   Alcohol use: Yes    Alcohol/week: 2.0 standard drinks of alcohol    Types: 2 Glasses of wine per week    Comment: occasional   Drug use: No   Sexual activity: Yes    Birth control/protection: Post-menopausal  Other Topics Concern   Not on file  Social History Narrative   Not on file   Social Determinants of Health   Financial Resource Strain: Low Risk  (05/18/2022)   Overall Financial Resource Strain (CARDIA)    Difficulty of Paying Living  Expenses: Not hard at all  Food Insecurity: No Food Insecurity (05/18/2022)   Hunger Vital Sign    Worried About Running Out of Food in the Last Year: Never true    Ran Out of Food in the Last Year: Never true  Transportation Needs: No Transportation Needs (05/18/2022)   PRAPARE - Administrator, Civil Service (Medical): No    Lack of Transportation (Non-Medical): No  Physical Activity: Sufficiently Active (05/18/2022)   Exercise Vital Sign    Days of Exercise per Week: 4 days    Minutes of Exercise per Session: 40 min  Stress: Stress Concern Present (05/18/2022)   [employer] of Occupational Health - Occupational Stress Questionnaire    Feeling of Stress : To some extent  Social Connections: Socially Integrated (05/18/2022)   Social Connection and Isolation Panel [NHANES]    Frequency of Communication with Friends and Family: Three times a week    Frequency of Social Gatherings with Friends and Family: Twice a week    Attends Religious Services: More than 4 times per year    Active Member of [employer] or Organizations: Yes    Attends Banker Meetings: More than  4 times per year    Marital Status: Married    There were no vitals filed for this visit. There is no height or weight on file to calculate BMI.  Physical Exam  ASSESSMENT AND PLAN: There are no diagnoses linked to this encounter.  No follow-ups on file.  Ayriana Wix G. [place], MD  [facility] office.  Discharge Instructions   None

## 2023-02-28 ENCOUNTER — Ambulatory Visit (INDEPENDENT_AMBULATORY_CARE_PROVIDER_SITE_OTHER): Payer: BC Managed Care – PPO | Admitting: Family Medicine

## 2023-02-28 ENCOUNTER — Encounter: Payer: Self-pay | Admitting: Family Medicine

## 2023-02-28 ENCOUNTER — Ambulatory Visit (INDEPENDENT_AMBULATORY_CARE_PROVIDER_SITE_OTHER): Payer: BC Managed Care – PPO

## 2023-02-28 VITALS — BP 120/72 | HR 75 | Temp 98.5°F | Resp 16 | Ht 62.0 in | Wt 127.1 lb

## 2023-02-28 DIAGNOSIS — L219 Seborrheic dermatitis, unspecified: Secondary | ICD-10-CM

## 2023-02-28 DIAGNOSIS — J301 Allergic rhinitis due to pollen: Secondary | ICD-10-CM | POA: Diagnosis not present

## 2023-02-28 DIAGNOSIS — R053 Chronic cough: Secondary | ICD-10-CM

## 2023-02-28 NOTE — Patient Instructions (Addendum)
A few things to remember from today's visit:  Persistent cough for 3 weeks or longer - Plan: DG Chest 2 View  Monitor for new symptoms. We could try medication for acid reflux, let me know if you change your mind. Flonase nasal spray at bedtime 10-14 days then as needed. Nasal saline irrigations as needed.  Over the counter Cortizone cream , small amount on ear 2 times daily for up to 2 weeks the as needed.  Do not use My Chart to request refills or for acute issues that need immediate attention. If you send a my chart message, it may take a few days to be addressed, specially if I am not in the office.  Please be sure medication list is accurate. If a new problem present, please set up appointment sooner than planned today.

## 2023-03-02 ENCOUNTER — Ambulatory Visit: Payer: BC Managed Care – PPO | Admitting: Family Medicine

## 2023-04-06 ENCOUNTER — Encounter: Payer: Self-pay | Admitting: Family Medicine

## 2023-04-06 ENCOUNTER — Ambulatory Visit: Payer: BC Managed Care – PPO | Admitting: Family Medicine

## 2023-04-06 VITALS — BP 114/70 | HR 74 | Temp 98.2°F | Ht 62.21 in | Wt 126.8 lb

## 2023-04-06 DIAGNOSIS — T7840XA Allergy, unspecified, initial encounter: Secondary | ICD-10-CM | POA: Diagnosis not present

## 2023-04-06 MED ORDER — PREDNISONE 10 MG PO TABS: ORAL_TABLET | ORAL | 0 refills | Status: AC

## 2023-04-06 NOTE — Progress Notes (Signed)
Established Patient Office Visit  Subjective   Patient ID: Rebekah Pratt, female    DOB: [birth year]  Age: 62 y.o. MRN: [id number]  Chief Complaint  Patient presents with   Facial Swelling   Conjunctivitis    Patient complains of conjunctivitis, x1 mont, Tried Benadryl    HPI   Rebekah Pratt is seen with several week history of some swelling and pruritus around both eyes specially below the eye but also involving the upper lid.  They moved into a new house several months ago but she did not have symptoms until a few weeks ago.  She states they do have a new mattress and pillow but she is not sure if this could be a trigger.  No pets.  No change in make-up.  She has left off make up for the past week or so without much improvement.  She has a bit of watery discharge but no purulent secretions.  She has not tried any recent over-the-counter or prescription eyedrops.  She does use a eye mask at times for sleep but no new mask.  Denies any other skin rash other than very small pruritic patch on her neck  Past Medical History:  Diagnosis Date   Allergy    Chicken pox    Complication of anesthesia    sedation cause agitation/ only one time/ after D and C   Mitral valve prolapse    Past Surgical History:  Procedure Laterality Date   APPENDECTOMY  2003   BREAST BIOPSY Bilateral 1984   benign   DILATION AND CURETTAGE OF UTERUS     EYE MUSCLE SURGERY  1967   pt unsure of which eye   TONSILLECTOMY AND ADENOIDECTOMY  1970    reports that she has never smoked. She has never used smokeless tobacco. She reports current alcohol use of about 2.0 standard drinks of alcohol per week. She reports that she does not use drugs. family history includes Arthritis in her father; Healthy in her brother; Heart disease (age of onset: 25) in her father; Hypertension in her father. Allergies  Allergen Reactions   Penicillins Swelling    Review of Systems  Constitutional:  Negative for chills and fever.   Eyes:  Negative for blurred vision.  Skin:  Positive for itching and rash.      Objective:     BP 114/70 (BP Location: Left Arm, Patient Position: Sitting, Cuff Size: Normal)   Pulse 74   Temp 98.2 F (36.8 C) (Oral)   Ht 5' 2.21" (1.58 m)   Wt 126 lb 12.8 oz (57.5 kg)   SpO2 98%   BMI 23.04 kg/m    Physical Exam Vitals reviewed.  Constitutional:      General: She is not in acute distress. Eyes:     General:        Right eye: No discharge.        Left eye: No discharge.     Extraocular Movements: Extraocular movements intact.     Conjunctiva/sclera: Conjunctivae normal.  Cardiovascular:     Rate and Rhythm: Normal rate and regular rhythm.  Skin:    Findings: Rash present.     Comments: She has skin rash periocular bilaterally especially involving the lower lids and also upper lid region.  Conjunctival exam is normal.  Faintly erythematous.  Nonscaly.  No pustules.  No vesicles.  Nontender.  Neurological:     Mental Status: She is alert.      No results found for any  visits on 04/06/23.    The 10-year ASCVD risk score (Arnett DK, et al., 2019) is: 3%    Assessment & Plan:   Allergic appearing rash surrounding both eyes.  No evidence for cellulitis.  This looks more like a contact type allergy.  Etiology unclear.  -Continue to look for triggers -Leave off by mask for now when she sleeps and occasionally at night -Leave off make-up until this is fully cleared -Prednisone taper starting at 40 mg daily -If rash not resolving with steroids or comes back be in touch  Evelena Peat, MD

## 2023-07-19 ENCOUNTER — Other Ambulatory Visit: Payer: Self-pay | Admitting: Medical Genetics

## 2023-07-19 DIAGNOSIS — Z006 Encounter for examination for normal comparison and control in clinical research program: Secondary | ICD-10-CM

## 2023-08-26 ENCOUNTER — Encounter: Payer: Self-pay | Admitting: Family Medicine

## 2024-01-09 ENCOUNTER — Ambulatory Visit: Payer: Self-pay | Admitting: Family Medicine

## 2024-01-09 ENCOUNTER — Encounter: Payer: Self-pay | Admitting: Family Medicine

## 2024-01-09 VITALS — BP 120/74 | HR 78 | Temp 98.1°F | Wt 133.6 lb

## 2024-01-09 DIAGNOSIS — R21 Rash and other nonspecific skin eruption: Secondary | ICD-10-CM | POA: Diagnosis not present

## 2024-01-09 NOTE — Progress Notes (Signed)
 Established Patient Office Visit  Subjective   Patient ID: Rebekah Pratt, female    DOB: [birth year]  Age: 63 y.o. MRN: [id number]  Chief Complaint  Patient presents with   Allergic Reaction    Patient complains of allergic reaction to Alendronate, Patient reports facial pain, x1 week    Chest Pain    Patient complains of chest pain,    HPI   Rebekah Pratt is seen to discuss possible recent allergic reaction.  She states someone at her church had given her a type of beef tallow to try for dry skin.  She applied some to her anterior chest.  After about 12 hours she developed several red spots on her chest which were somewhat sore to touch.  They eventually resolved but she had little bit of residual soreness.  No vesicles.  These were not unilateral.  Denies any exertional chest pains.  No dyspnea.  No positional issues with regard to chest pain.  Chest pain is sore to touch but otherwise not provoked..  Interestingly, she states she cannot touch cows because of allergic reaction but has no allergy to consumption of beef.  She also had questions regarding osteoporosis.  Her gynecologist had started her on Fosamax back in November.  She had T-score -3.2 spine.  She recently went to her dentist and was told that she had some decay.  She apparently has a history of some bruxism as well and has had some recent intermittent jaw pain.  Her dentist informed her if she remains on Fosamax she would be unable to have some dental procedures in the future in terms of dental implants.  She is now rethinking whether she wants to remain on Fosamax.  She does stay very active.  Takes calcium and vitamin D.  Past Medical History:  Diagnosis Date   Allergy    Chicken pox    Complication of anesthesia    sedation cause agitation/ only one time/ after D and C   Mitral valve prolapse    Past Surgical History:  Procedure Laterality Date   APPENDECTOMY  2003   BREAST BIOPSY Bilateral 1984   benign   DILATION  AND CURETTAGE OF UTERUS     EYE MUSCLE SURGERY  1967   pt unsure of which eye   TONSILLECTOMY AND ADENOIDECTOMY  1970    reports that she has never smoked. She has never used smokeless tobacco. She reports current alcohol use of about 2.0 standard drinks of alcohol per week. She reports that she does not use drugs. family history includes Arthritis in her father; Healthy in her brother; Heart disease (age of onset: 80) in her father; Hypertension in her father. Allergies  Allergen Reactions   Penicillins Swelling    Review of Systems  Constitutional:  Negative for fever.  Respiratory:  Negative for cough, hemoptysis and shortness of breath.   Cardiovascular:  Negative for leg swelling.       See HPI      Objective:     BP 120/74 (BP Location: Left Arm, Patient Position: Sitting, Cuff Size: Normal)   Pulse 78   Temp 98.1 F (36.7 C) (Oral)   Wt 133 lb 9.6 oz (60.6 kg)   SpO2 97%   BMI 24.27 kg/m  BP Readings from Last 3 Encounters:  01/09/24 120/74  04/06/23 114/70  02/28/23 120/72   Wt Readings from Last 3 Encounters:  01/09/24 133 lb 9.6 oz (60.6 kg)  04/06/23 126 lb 12.8 oz (57.5 kg)  02/28/23 127 lb 2 oz (57.7 kg)      Physical Exam Vitals reviewed.  Constitutional:      General: She is not in acute distress.    Appearance: She is not ill-appearing.  Cardiovascular:     Rate and Rhythm: Normal rate and regular rhythm.     Heart sounds: Normal heart sounds. No murmur heard. Pulmonary:     Effort: Pulmonary effort is normal.  Skin:    Findings: No rash.     Comments: Upper chest area examined.  She has no visible rash at this time other than a couple very nonspecific small papules.  No pustules.  No vesicles.  Neurological:     Mental Status: She is alert.      No results found for any visits on 01/09/24.    The 10-year ASCVD risk score (Arnett DK, et al., 2019) is: 3.7%    Assessment & Plan:   #1 recent rash anterior chest.  This sounded more  allergic in nature related to topical as above.  Symptoms (rash) resolved after several days.  She has had some mild soreness to touch.  No angina type symptoms.  Follow-up for any recurrent rash  #2 osteoporosis.  Patient had several questions regarding osteoporosis therapies.  She has concerns because of her dental problems that she may not be able to be a candidate for for her dental implants if indicated.  She is encouraged to discuss with her gynecologist who prescribe the Fosamax her concerns.  We did mention that other options such as Prolia would also carry some the same warnings for osteonecrosis  Evelena Peat, MD

## 2024-04-23 ENCOUNTER — Other Ambulatory Visit (HOSPITAL_COMMUNITY)
Admission: RE | Admit: 2024-04-23 | Discharge: 2024-04-23 | Disposition: A | Discharge status: Home / Self Care | Payer: Self-pay | Source: Ambulatory Visit | Attending: Medical Genetics | Admitting: Medical Genetics

## 2024-04-23 DIAGNOSIS — Z006 Encounter for examination for normal comparison and control in clinical research program: Secondary | ICD-10-CM | POA: Insufficient documentation

## 2024-05-05 LAB — GENECONNECT MOLECULAR SCREEN: Genetic Analysis Overall Interpretation: NEGATIVE

## 2024-08-20 LAB — LAB REPORT - SCANNED
A1c: 5.6
EGFR: 82

## 2024-09-25 ENCOUNTER — Encounter (HOSPITAL_BASED_OUTPATIENT_CLINIC_OR_DEPARTMENT_OTHER): Payer: Self-pay

## 2024-09-25 ENCOUNTER — Emergency Department (HOSPITAL_BASED_OUTPATIENT_CLINIC_OR_DEPARTMENT_OTHER)
Admission: EM | Admit: 2024-09-25 | Discharge: 2024-09-25 | Disposition: A | Discharge status: Home / Self Care | Source: Ambulatory Visit | Attending: Emergency Medicine | Admitting: Emergency Medicine

## 2024-09-25 ENCOUNTER — Emergency Department (HOSPITAL_BASED_OUTPATIENT_CLINIC_OR_DEPARTMENT_OTHER): Admitting: Radiology

## 2024-09-25 ENCOUNTER — Ambulatory Visit: Payer: Self-pay

## 2024-09-25 ENCOUNTER — Other Ambulatory Visit: Payer: Self-pay

## 2024-09-25 DIAGNOSIS — R072 Precordial pain: Secondary | ICD-10-CM | POA: Insufficient documentation

## 2024-09-25 DIAGNOSIS — R0789 Other chest pain: Secondary | ICD-10-CM | POA: Diagnosis present

## 2024-09-25 DIAGNOSIS — R0602 Shortness of breath: Secondary | ICD-10-CM | POA: Diagnosis not present

## 2024-09-25 LAB — BASIC METABOLIC PANEL WITH GFR
Anion gap: 10 (ref 5–15)
BUN: 16 mg/dL (ref 8–23)
CO2: 28 mmol/L (ref 22–32)
Calcium: 10.4 mg/dL — ABNORMAL HIGH (ref 8.9–10.3)
Chloride: 102 mmol/L (ref 98–111)
Creatinine, Ser: 1.1 mg/dL — ABNORMAL HIGH (ref 0.44–1.00)
GFR, Estimated: 56 mL/min — ABNORMAL LOW (ref >60)
Glucose, Bld: 98 mg/dL (ref 70–99)
Potassium: 3.9 mmol/L (ref 3.5–5.1)
Sodium: 140 mmol/L (ref 135–145)

## 2024-09-25 LAB — CBC
HCT: 38.4 % (ref 36.0–46.0)
Hemoglobin: 12.5 g/dL (ref 12.0–15.0)
MCH: 29.5 pg (ref 26.0–34.0)
MCHC: 32.6 g/dL (ref 30.0–36.0)
MCV: 90.6 fL (ref 80.0–100.0)
Platelets: 270 K/uL (ref 150–400)
RBC: 4.24 MIL/uL (ref 3.87–5.11)
RDW: 13.9 % (ref 11.5–15.5)
WBC: 4 K/uL (ref 4.0–10.5)
nRBC: 0 % (ref 0.0–0.2)

## 2024-09-25 LAB — TROPONIN T, HIGH SENSITIVITY
Troponin T High Sensitivity: <15 ng/L (ref 0–19)
Troponin T High Sensitivity: <15 ng/L (ref 0–19)

## 2024-09-25 LAB — PRO BRAIN NATRIURETIC PEPTIDE: Pro Brain Natriuretic Peptide: 59.2 pg/mL (ref <300.0)

## 2024-09-25 LAB — D-DIMER, QUANTITATIVE: D-Dimer, Quant: <0.27 ug{FEU}/mL (ref 0.00–0.50)

## 2024-09-25 MED ORDER — FAMOTIDINE 20 MG PO TABS: 20.0000 mg | ORAL_TABLET | Freq: Two times a day (BID) | ORAL | 0 refills | Status: AC

## 2024-09-25 NOTE — ED Notes (Signed)
 DC paperwork given and verbally understood.

## 2024-09-25 NOTE — Telephone Encounter (Signed)
 Spoke with the patient and advised she go to the closet ER now and offered to inform her of locations close to her home.  Patient stated she has to watch her grandsons and cannot go until tomorrow.  Patient was advised to seek treatment immediately for chest pain and should not delay evaluation for days.

## 2024-09-25 NOTE — Telephone Encounter (Signed)
 Patient disconnected call prior to warm transfer. Nurse will attempt to contact patient.

## 2024-09-25 NOTE — Discharge Instructions (Signed)
 Please read and follow all provided instructions.  Your diagnoses today include:  1. Precordial pain     Tests performed today include: An EKG of your heart A chest x-ray Cardiac enzymes - a blood test for heart muscle damage, were both normal Blood counts and electrolytes D-dimer: screening test for blood clot was negative BNP: screening test for heart failure was negative Vital signs. See below for your results today.   Medications prescribed:  Pepcid  (famotidine ) - antihistamine  You can find this medication over-the-counter.   DO NOT exceed:  20mg  Pepcid  every 12 hours  Take any prescribed medications only as directed.  Follow-up instructions: Please follow-up with your primary care provider for further evaluation of your symptoms.   Return instructions:  SEEK IMMEDIATE MEDICAL ATTENTION IF: You have severe chest pain, especially if the pain is crushing or pressure-like and spreads to the arms, back, neck, or jaw, or if you have sweating, nausea or vomiting, or trouble with breathing. THIS IS AN EMERGENCY. Do not wait to see if the pain will go away. Get medical help at once. Call 911. DO NOT drive yourself to the hospital.  Your chest pain gets worse and does not go away after a few minutes of rest.  You have an attack of chest pain lasting longer than what you usually experience.  You have significant dizziness, if you pass out, or have trouble walking.  You have chest pain not typical of your usual pain for which you originally saw your caregiver.  You have any other emergent concerns regarding your health.  Additional Information: Chest pain comes from many different causes. Your caregiver has diagnosed you as having chest pain that is not specific for one problem, but does not require admission.  You are at low risk for an acute heart condition or other serious illness.   Your vital signs today were: BP 125/76   Pulse 67   Temp 97.6 F (36.4 C)   Resp 16   Ht 5'  2 (1.575 m)   Wt 56.7 kg   SpO2 99%   BMI 22.86 kg/m  If your blood pressure (BP) was elevated above 135/85 this visit, please have this repeated by your doctor within one month. --------------

## 2024-09-25 NOTE — ED Provider Notes (Cosign Needed Addendum)
 [place] EMERGENCY DEPARTMENT AT [facility] Provider Note   CSN: [id number] Arrival date & time: 09/25/24  1201     Patient presents with: Chest Pain   Rebekah Pratt is a 63 y.o. female.   Patient presents to the emergency department today for evaluation of chest heaviness.  Patient reports intermittent chest symptoms over the past 1 to 2 weeks.  She reports 5 to 10-minute episodes of chest heaviness, initially occurring once every day or so, but over the past few days, multiple times daily.  She awoke this morning around 8 AM with symptoms that were more severe and more persistent than before, prompting emergency department visit.  Heaviness is substernal and does not radiate.  She denies associated vomiting or diaphoresis.  Symptoms are nonexertional and can occur at any time.  She does continue to exercise without shortness of breath but has noted more shortness of breath than normal recently with activity such as walking up a flight of stairs.  Patient denies history of hypertension or diabetes.  She states that she has had high HDL and high LDL but is not on medication for cholesterol.  No smoking or tobacco use.  Father had open heart surgery in his 73s.  Patient has never had any provocative testing. Patient denies risk factors for pulmonary embolism including: unilateral leg swelling, history of DVT/PE/other blood clots, use of exogenous hormones, recent immobilizations, recent surgery, recent travel (>4hr segment), malignancy, hemoptysis.  Symptoms do not seem to correlate with eating or drinking.         Prior to Admission medications  Medication Sig Start Date End Date Taking? Authorizing Provider  APPLE CIDER VINEGAR PO Take by mouth. Drink 1 tsp weekly    [provider]  Ascorbic Acid (VITAMIN C) 1000 MG tablet Take by mouth daily.     [provider]  B Complex Vitamins (VITAMIN B COMPLEX PO) Take by mouth daily.     [provider]   CALCIUM PO Take by mouth.    [provider]  Cholecalciferol (VITAMIN D3) 250 MCG (10000 UT) TABS     [provider]  HONEY PO Take by mouth. Take honey with apple cider vinegar weekly    [provider]  Magnesium 100 MG TABS     [provider]  MILK THISTLE EXTRACT PO Take by mouth.    [provider]  NON FORMULARY Strontium    [provider]  predniSONE  (DELTASONE ) 10 MG tablet Taper as follows: 4-4-4-4-3-3-2-2-1-1 04/06/23   Burchette, Wolm ORN, MD  triamcinolone  cream (KENALOG ) 0.1 % Apply 1 Application topically 2 (two) times daily. 05/19/22   Burchette, Wolm ORN, MD    Allergies: Penicillins    Review of Systems  Updated Vital Signs BP 127/75   Pulse 69   Temp 97.6 F (36.4 C)   Resp 14   Ht 5' 2 (1.575 m)   Wt 56.7 kg   SpO2 98%   BMI 22.86 kg/m   Physical Exam Vitals and nursing note reviewed.  Constitutional:      Appearance: She is well-developed. She is not diaphoretic.  HENT:     Head: Normocephalic and atraumatic.     Mouth/Throat:     Mouth: Mucous membranes are not dry.  Eyes:     Conjunctiva/sclera: Conjunctivae normal.  Neck:     Vascular: Normal carotid pulses. No JVD.     Trachea: Trachea normal. No tracheal deviation.  Cardiovascular:  Rate and Rhythm: Normal rate and regular rhythm.     Pulses: No decreased pulses.          Radial pulses are 2+ on the right side and 2+ on the left side.     Heart sounds: Normal heart sounds, S1 normal and S2 normal. No murmur heard. Pulmonary:     Effort: Pulmonary effort is normal. No respiratory distress.     Breath sounds: No wheezing.  Chest:     Chest wall: No tenderness.  Abdominal:     General: Bowel sounds are normal.     Palpations: Abdomen is soft.     Tenderness: There is no abdominal tenderness. There is no guarding or rebound.  Musculoskeletal:        General: Normal range of motion.     Cervical back: Normal range of motion and neck  supple. No muscular tenderness.     Right lower leg: No tenderness. No edema.     Left lower leg: No tenderness. No edema.  Skin:    General: Skin is warm and dry.     Coloration: Skin is not pale.  Neurological:     Mental Status: She is alert.     (all labs ordered are listed, but only abnormal results are displayed) Labs Reviewed  BASIC METABOLIC PANEL WITH GFR - Abnormal; Notable for the following components:      Result Value   Creatinine, Ser 1.10 (*)    Calcium 10.4 (*)    GFR, Estimated 56 (*)    All other components within normal limits  CBC  PRO BRAIN NATRIURETIC PEPTIDE  D-DIMER, QUANTITATIVE (NOT AT [facility])  TROPONIN T, HIGH SENSITIVITY  TROPONIN T, HIGH SENSITIVITY   ED ECG REPORT   Date: 09/25/2024  Rate: 66  Rhythm: normal sinus rhythm  QRS Axis: right  Intervals: normal  ST/T Wave abnormalities: normal, borderline st depression, no old for compare  Conduction Disutrbances:none  Narrative Interpretation:   Old EKG Reviewed: none available  I have personally reviewed the EKG tracing and agree with the computerized printout as noted.   Radiology: DG Chest 2 View Result Date: 09/25/2024 CLINICAL DATA:  chest pain EXAM: CHEST - 2 VIEW COMPARISON:  Feb 28, 2023 FINDINGS: No focal airspace consolidation, pleural effusion, or pneumothorax. No cardiomegaly.No acute fracture or destructive lesion. IMPRESSION: No acute cardiopulmonary abnormality. Electronically Signed   By: Rogelia Myers M.D.   On: 09/25/2024 13:20     Procedures   Medications Ordered in the ED - No data to display  ED Course  Patient seen and examined. History obtained directly from patient and family member at bedside.  Labs/EKG: Reviewed and interpreted labs ordered in triage including BMP with mildly elevated creatinine at 1.10 with a BUN of 16, elevated calcium at 10.4; CBC unremarkable; first troponin less than 15.  EKG personally reviewed and interpreted as above.  Given nonspecific  pain, and element of shortness of breath, added D-dimer and BNP.  Will obtain second troponin and repeat EKG.  Imaging: Chest x-ray appears negative, awaiting radiology read.  Medications/Fluids: None ordered  Most recent vital signs reviewed and are as follows: BP 127/75   Pulse 69   Temp 97.6 F (36.4 C)   Resp 14   Ht 5' 2 (1.575 m)   Wt 56.7 kg   SpO2 98%   BMI 22.86 kg/m   Initial impression: Atypical chest heaviness.  Heart score equals 3.  3:26 PM Reassessment performed. Patient appears stable, no  current pain, reports some intermittent mild pressure.  ED ECG REPORT   Date: 09/25/2024  Rate: 64  Rhythm: normal sinus rhythm  QRS Axis: normal  Intervals: normal  ST/T Wave abnormalities: normal, borderline st depression  Conduction Disutrbances:none  Narrative Interpretation:   Old EKG Reviewed: unchanged  I have personally reviewed the EKG tracing and agree with the computerized printout as noted.   Labs personally reviewed and interpreted including: Second troponin was less than 15.  D-dimer and BNP were negative.  Imaging personally visualized and interpreted including: Chest x-ray, agree negative.  Reviewed pertinent lab work and imaging with patient at bedside. Questions answered.   Most current vital signs reviewed and are as follows: BP 125/76   Pulse 67   Temp 97.6 F (36.4 C)   Resp 16   Ht 5' 2 (1.575 m)   Wt 56.7 kg   SpO2 99%   BMI 22.86 kg/m   Plan: Discharge to home.   Prescriptions written for: Pepcid  trial, twice daily x 2 weeks.  Return and follow-up instructions: I encouraged patient to return to ED with severe chest pain, especially if the pain is crushing or pressure-like and spreads to the arms, back, neck, or jaw, or if they have associated sweating, vomiting, or shortness of breath with the pain, or significant pain with activity. We discussed that the evaluation here today indicates a low-risk of serious cause of chest pain,  including heart trouble or a blood clot, but no evaluation is perfect and chest pain can evolve with time. The patient verbalized understanding and agreed.  I encouraged patient to follow-up with their provider as needed, I did place a cardiology referral on behalf of the patient for new chest pain with some risk factors.  She could potentially benefit from additional outpatient testing for restratification purposes.                                    Medical Decision Making Amount and/or Complexity of Data Reviewed Labs: ordered. Radiology: ordered.   For this patient's complaint of chest pain, the following emergent conditions were considered on the differential diagnosis: acute coronary syndrome, pulmonary embolism, pneumothorax, myocarditis, pericardial tamponade, aortic dissection, thoracic aortic aneurysm complication, esophageal perforation.   Other causes were also considered including: gastroesophageal reflux disease, musculoskeletal pain including costochondritis, pneumonia/pleurisy, herpes zoster, pericarditis.  In regards to possibility of ACS, patient has atypical features of pain, non-ischemic and unchanged EKG and negative troponin(s). Heart score was calculated to be 3.   In regards to possibility of PE, symptoms are atypical for PE and risk profile is low, D-dimer is negative.  Will trial Pepcid  to see if this helps improve symptoms, however unclear if symptoms are GI in nature.  Recommended outpatient follow-up as patient does have some risk factors and new chest pain.  Patient's father had CABG in his 54s.  Cardiology referral placed.  The patient's vital signs, pertinent lab work and imaging were reviewed and interpreted as discussed in the ED course. Hospitalization was considered for further testing, treatments, or serial exams/observation. However as patient is well-appearing, has a stable exam, and reassuring studies today, I do not feel that they warrant admission at  this time. This plan was discussed with the patient who verbalizes agreement and comfort with this plan and seems reliable and able to return to the Emergency Department with worsening or changing symptoms.  Final diagnoses:  Precordial pain    ED Discharge Orders          Ordered    famotidine  (PEPCID ) 20 MG tablet  2 times daily        09/25/24 1523    Ambulatory referral to Cardiology       Comments: If you have not heard from the Cardiology office within the next 72 hours please call [phone].   09/25/24 1523                Desiderio Chew, PA-C 09/25/24 1541

## 2024-09-25 NOTE — ED Triage Notes (Signed)
 Presents to ED with c/o mid chest pain that's been intermittent for one week. Worse today. Recent cough. Endorses SOB with exertion

## 2024-09-25 NOTE — Telephone Encounter (Signed)
 FYI Only or Action Required?: FYI only for provider: ED advised.  Patient was last seen in primary care on 01/09/2024 by Rebekah Pratt ORN, MD.  Called Nurse Triage reporting Chest Pain.  Symptoms began a week ago.  Interventions attempted: Nothing.  Symptoms are: unchanged.  Triage Disposition: Go to ED Now (or PCP Triage)  Patient/caregiver understands and will follow disposition?: No, wishes to speak with PCP   Copied from CRM [id number]. Topic: Clinical - Red Word Triage >> Sep 25, 2024  8:39 AM Carlyon D wrote: Red Word that prompted transfer to Nurse Triage: Breast bone middle of chest pain Reason for Disposition  [1] Chest pain lasts > 5 minutes AND [2] occurred in past 3 days (72 hours) (Exception: Feels exactly the same as previously diagnosed heartburn and has accompanying sour taste in mouth.)  Answer Assessment - Initial Assessment Questions Advised ED now. Patient declined. Advised 911 if symptoms worsen, patient verbalized understanding.  Patient requests appt tomorrow with Dr. Micheal.  1. LOCATION: Where does it hurt?      Middle of chest, starts at bottom to top 2. RADIATION: Does the pain go anywhere else? (e.g., into neck, jaw, arms, back)     no 3. ONSET: When did the chest pain begin? (Minutes, hours or days)      Week ago 4. PATTERN: Does the pain come and go, or has it been constant since it started?  Does it get worse with exertion?      Comes and goes 5. DURATION: How long does it last (e.g., seconds, minutes, hours)     5-27mins 6. SEVERITY: How bad is the pain?  (e.g., Scale 1-10; mild, moderate, or severe)     3/10 7. CARDIAC RISK FACTORS: Do you have any history of heart problems or risk factors for heart disease? (e.g., angina, prior heart attack; diabetes, high blood pressure, high cholesterol, smoker, or strong family history of heart disease)     no 8. PULMONARY RISK FACTORS: Do you have any history of lung disease?  (e.g.,  blood clots in lung, asthma, emphysema, birth control pills)     no 9. CAUSE: What do you think is causing the chest pain?     unsure 10. OTHER SYMPTOMS: Do you have any other symptoms? (e.g., dizziness, nausea, vomiting, sweating, fever, difficulty breathing, cough)        Denies fever, chills, n/v, sweating, diff breathing, faint  Protocols used: Chest Pain-A-AH

## 2024-09-28 NOTE — Telephone Encounter (Signed)
 Agree with advice  to seek care in ER for chest pain  Wolm LELON Scarlet MD [facility]

## 2024-10-01 ENCOUNTER — Ambulatory Visit: Admitting: Family Medicine

## 2024-10-01 ENCOUNTER — Encounter: Payer: Self-pay | Admitting: Family Medicine

## 2024-10-01 VITALS — BP 110/70 | HR 75 | Temp 98.1°F | Wt 136.1 lb

## 2024-10-01 DIAGNOSIS — R079 Chest pain, unspecified: Secondary | ICD-10-CM

## 2024-10-01 DIAGNOSIS — E785 Hyperlipidemia, unspecified: Secondary | ICD-10-CM | POA: Diagnosis not present

## 2024-10-01 DIAGNOSIS — Z8249 Family history of ischemic heart disease and other diseases of the circulatory system: Secondary | ICD-10-CM | POA: Diagnosis not present

## 2024-10-01 NOTE — Progress Notes (Signed)
 "  Established Patient Office Visit  Subjective   Patient ID: Rebekah Pratt, female    DOB: January 07, 1961  Age: 63 y.o. MRN: [id number]  Chief Complaint  Patient presents with   Hospitalization Follow-up    HPI    Rebekah Pratt is seen for ER follow-up.  She relates onset 2 to 3 weeks ago of some lower sternal chest pain.  She described this as a substernal pressure that was intermittent usually lasting less than 5 minutes.  She did not notice any definite exertional component to the pain but did have some recent exertional dyspnea with going up stairs carrying her grandson.  On the morning that she went to the ER which was the 18th she had particularly worse symptoms.  No diaphoresis, nausea, or vomiting.  No personal cardiac history.  Non-smoker.  No history of diabetes.  She does have history of hyperlipidemia with elevated LDL cholesterol.  Family history of CAD with her father having CABG estimated around age 84.  He was a smoker.  She also had paternal uncle that apparently had CAD in their late 53s.  ER notes and studies reviewed.  She had multiple labs including D-dimer, troponins, BNP level which were all normal.  EKG no acute findings.  Was felt she was probably having GI related symptoms.  She started taking Pepcid  20 mg initially twice daily and now daily and symptoms have essentially resolved.  Referral apparently was placed to cardiology but she has not heard anything back yet.  Past Medical History:  Diagnosis Date   Allergy    Chicken pox    Complication of anesthesia    sedation cause agitation/ only one time/ after D and C   Mitral valve prolapse    Past Surgical History:  Procedure Laterality Date   APPENDECTOMY  2003   BREAST BIOPSY Bilateral 1984   benign   DILATION AND CURETTAGE OF UTERUS     EYE MUSCLE SURGERY  1967   pt unsure of which eye   TONSILLECTOMY AND ADENOIDECTOMY  1970    reports that she has never smoked. She has never used smokeless tobacco. She  reports current alcohol use of about 2.0 standard drinks of alcohol per week. She reports that she does not use drugs. family history includes Arthritis in her father; Healthy in her brother; Heart disease (age of onset: 75) in her father; Hypertension in her father. Allergies[1]  Review of Systems  Constitutional:  Negative for malaise/fatigue and weight loss.  Eyes:  Negative for blurred vision.  Respiratory:  Negative for cough and shortness of breath.   Cardiovascular:        See HPI  Gastrointestinal:  Negative for abdominal pain, nausea and vomiting.  Neurological:  Negative for dizziness, weakness and headaches.      Objective:     BP 110/70   Pulse 75   Temp 98.1 F (36.7 C)   Wt 136 lb 1.6 oz (61.7 kg)   SpO2 97%   BMI 24.89 kg/m  BP Readings from Last 3 Encounters:  10/01/24 110/70  09/25/24 110/65  01/09/24 120/74   Wt Readings from Last 3 Encounters:  10/01/24 136 lb 1.6 oz (61.7 kg)  09/25/24 125 lb (56.7 kg)  01/09/24 133 lb 9.6 oz (60.6 kg)      Physical Exam Vitals reviewed.  Constitutional:      Appearance: She is well-developed.  Eyes:     Pupils: Pupils are equal, round, and reactive to light.  Neck:  Thyroid : No thyromegaly.     Vascular: No JVD.  Cardiovascular:     Rate and Rhythm: Normal rate and regular rhythm.     Heart sounds:     No gallop.     Comments: ?  Faint systolic ejection murmur heard over aortic valve region. Pulmonary:     Effort: Pulmonary effort is normal. No respiratory distress.     Breath sounds: Normal breath sounds. No wheezing or rales.  Musculoskeletal:     Cervical back: Neck supple.  Neurological:     Mental Status: She is alert.      No results found for any visits on 10/01/24.    The 10-year ASCVD risk score (Arnett DK, et al., 2019) is: 3.1%    Assessment & Plan:   Magaret is a 63 year old female seen with recent mostly nonexertional chest pain.  ER visit reviewed with review of EKG and  multiple labs.  Symptoms have improved with Pepcid  which suggests likely GI origin.  However, she has strong family history of premature CAD in her father and paternal uncle.  She does have history of hyperlipidemia with total cholesterol 280 and LDL cholesterol 168 couple years ago.  Patient has low calculated risk of CAD event of 3.1%.  We did discuss further risk stratification and she agrees  -Set up coronary calcium scan - Check LP(a) - Patient has pending follow-up with cardiology - Continue Pepcid  - Follow-up immediately for any exertional chest pain or other concerns -?  Faint systolic ejection murmur heard over aortic valve.  Consider possible echocardiogram.  Will defer to cardiology   No follow-ups on file.    Wolm Scarlet, MD     [1]  Allergies Allergen Reactions   Penicillins Swelling   "

## 2024-10-06 ENCOUNTER — Ambulatory Visit: Payer: Self-pay | Admitting: Family Medicine

## 2024-10-06 LAB — LIPOPROTEIN A (LPA): Lipoprotein (a): 93 nmol/L — ABNORMAL HIGH

## 2024-10-17 ENCOUNTER — Ambulatory Visit (HOSPITAL_BASED_OUTPATIENT_CLINIC_OR_DEPARTMENT_OTHER)
Admission: RE | Admit: 2024-10-17 | Discharge: 2024-10-17 | Disposition: A | Payer: Self-pay | Source: Ambulatory Visit | Attending: Family Medicine | Admitting: Family Medicine

## 2024-10-17 DIAGNOSIS — Z8249 Family history of ischemic heart disease and other diseases of the circulatory system: Secondary | ICD-10-CM | POA: Insufficient documentation

## 2024-10-27 ENCOUNTER — Telehealth: Payer: Self-pay

## 2024-10-27 NOTE — Telephone Encounter (Signed)
 Copied from CRM [id number]. Topic: Clinical - Medical Advice >> Oct 27, 2024 12:33 PM Deleta RAMAN wrote: Reason for CRM: patient would like to know if she should still go see the cardiologist regarding hospital visit. Patient states she has changed her diet and feels better. Believes she does not need to see cardiologist. [place]pped [place] believe that gave her heart burn. Issues are going and would like to know Dr. Micheal. Please contact [phone]

## 2024-10-27 NOTE — Telephone Encounter (Signed)
 Patient informed of the message below and voiced understanding

## 2024-11-10 ENCOUNTER — Ambulatory Visit (HOSPITAL_BASED_OUTPATIENT_CLINIC_OR_DEPARTMENT_OTHER): Admitting: Cardiology
